# Patient Record
Sex: Female | Born: 1970
Health system: Southern US, Community
[De-identification: ages and names within clinical notes are randomized; demographics above are authoritative.]

## PROBLEM LIST (undated history)

## (undated) DIAGNOSIS — D509 Iron deficiency anemia, unspecified: Secondary | ICD-10-CM

## (undated) DIAGNOSIS — M797 Fibromyalgia: Secondary | ICD-10-CM

## (undated) DIAGNOSIS — M5136 Other intervertebral disc degeneration, lumbar region: Secondary | ICD-10-CM

## (undated) DIAGNOSIS — M51369 Other intervertebral disc degeneration, lumbar region without mention of lumbar back pain or lower extremity pain: Secondary | ICD-10-CM

## (undated) DIAGNOSIS — F329 Major depressive disorder, single episode, unspecified: Secondary | ICD-10-CM

## (undated) DIAGNOSIS — M5481 Occipital neuralgia: Secondary | ICD-10-CM

## (undated) DIAGNOSIS — F319 Bipolar disorder, unspecified: Secondary | ICD-10-CM

## (undated) DIAGNOSIS — I10 Essential (primary) hypertension: Secondary | ICD-10-CM

## (undated) DIAGNOSIS — F32A Depression, unspecified: Secondary | ICD-10-CM

## (undated) HISTORY — DX: Bipolar disorder, unspecified: F31.9

## (undated) HISTORY — DX: Fibromyalgia: M79.7

## (undated) HISTORY — DX: Depression, unspecified: F32.A

## (undated) HISTORY — DX: Major depressive disorder, single episode, unspecified: F32.9

## (undated) HISTORY — PX: PLACEMENT OF BREAST IMPLANTS: SHX6334

## (undated) HISTORY — DX: Iron deficiency anemia, unspecified: D50.9

## (undated) HISTORY — DX: Other intervertebral disc degeneration, lumbar region: M51.36

## (undated) HISTORY — DX: Other intervertebral disc degeneration, lumbar region without mention of lumbar back pain or lower extremity pain: M51.369

## (undated) HISTORY — DX: Occipital neuralgia: M54.81

---

## 2017-12-17 DIAGNOSIS — M5135 Other intervertebral disc degeneration, thoracolumbar region: Secondary | ICD-10-CM | POA: Diagnosis not present

## 2017-12-17 DIAGNOSIS — M25511 Pain in right shoulder: Secondary | ICD-10-CM | POA: Diagnosis not present

## 2017-12-17 DIAGNOSIS — Z6830 Body mass index (BMI) 30.0-30.9, adult: Secondary | ICD-10-CM | POA: Diagnosis not present

## 2017-12-17 DIAGNOSIS — R42 Dizziness and giddiness: Secondary | ICD-10-CM | POA: Diagnosis not present

## 2017-12-17 DIAGNOSIS — M797 Fibromyalgia: Secondary | ICD-10-CM | POA: Diagnosis not present

## 2018-01-05 DIAGNOSIS — E782 Mixed hyperlipidemia: Secondary | ICD-10-CM | POA: Diagnosis not present

## 2018-01-05 DIAGNOSIS — M797 Fibromyalgia: Secondary | ICD-10-CM | POA: Diagnosis not present

## 2018-01-05 DIAGNOSIS — Z0000 Encounter for general adult medical examination without abnormal findings: Secondary | ICD-10-CM | POA: Diagnosis not present

## 2018-01-05 DIAGNOSIS — M5481 Occipital neuralgia: Secondary | ICD-10-CM | POA: Diagnosis not present

## 2018-01-07 DIAGNOSIS — R42 Dizziness and giddiness: Secondary | ICD-10-CM | POA: Diagnosis not present

## 2018-01-07 DIAGNOSIS — Z1331 Encounter for screening for depression: Secondary | ICD-10-CM | POA: Diagnosis not present

## 2018-01-07 DIAGNOSIS — E785 Hyperlipidemia, unspecified: Secondary | ICD-10-CM | POA: Diagnosis not present

## 2018-01-07 DIAGNOSIS — M797 Fibromyalgia: Secondary | ICD-10-CM | POA: Diagnosis not present

## 2018-01-07 DIAGNOSIS — D473 Essential (hemorrhagic) thrombocythemia: Secondary | ICD-10-CM | POA: Diagnosis not present

## 2018-01-07 DIAGNOSIS — M50321 Other cervical disc degeneration at C4-C5 level: Secondary | ICD-10-CM | POA: Diagnosis not present

## 2018-01-07 DIAGNOSIS — M25511 Pain in right shoulder: Secondary | ICD-10-CM | POA: Diagnosis not present

## 2018-01-07 DIAGNOSIS — Z0001 Encounter for general adult medical examination with abnormal findings: Secondary | ICD-10-CM | POA: Diagnosis not present

## 2018-01-30 ENCOUNTER — Inpatient Hospital Stay (HOSPITAL_COMMUNITY): Payer: Medicare Other

## 2018-01-30 ENCOUNTER — Encounter (HOSPITAL_COMMUNITY): Payer: Self-pay | Admitting: Internal Medicine

## 2018-01-30 ENCOUNTER — Inpatient Hospital Stay (HOSPITAL_COMMUNITY): Payer: Medicare Other | Attending: Internal Medicine | Admitting: Internal Medicine

## 2018-01-30 VITALS — BP 121/76 | HR 124 | Temp 98.0°F | Resp 16 | Ht 59.75 in | Wt 153.2 lb

## 2018-01-30 DIAGNOSIS — N920 Excessive and frequent menstruation with regular cycle: Secondary | ICD-10-CM | POA: Insufficient documentation

## 2018-01-30 DIAGNOSIS — D500 Iron deficiency anemia secondary to blood loss (chronic): Secondary | ICD-10-CM | POA: Diagnosis not present

## 2018-01-30 DIAGNOSIS — D473 Essential (hemorrhagic) thrombocythemia: Secondary | ICD-10-CM | POA: Diagnosis not present

## 2018-01-30 DIAGNOSIS — M25559 Pain in unspecified hip: Secondary | ICD-10-CM

## 2018-01-30 DIAGNOSIS — M797 Fibromyalgia: Secondary | ICD-10-CM | POA: Diagnosis not present

## 2018-01-30 DIAGNOSIS — D75839 Thrombocytosis, unspecified: Secondary | ICD-10-CM

## 2018-01-30 DIAGNOSIS — F329 Major depressive disorder, single episode, unspecified: Secondary | ICD-10-CM | POA: Insufficient documentation

## 2018-01-30 DIAGNOSIS — Z803 Family history of malignant neoplasm of breast: Secondary | ICD-10-CM | POA: Diagnosis not present

## 2018-01-30 LAB — COMPREHENSIVE METABOLIC PANEL
ALK PHOS: 106 U/L (ref 38–126)
ALT: 76 U/L — ABNORMAL HIGH (ref 14–54)
AST: 55 U/L — AB (ref 15–41)
Albumin: 4 g/dL (ref 3.5–5.0)
Anion gap: 11 (ref 5–15)
BUN: 12 mg/dL (ref 6–20)
CALCIUM: 9.8 mg/dL (ref 8.9–10.3)
CO2: 27 mmol/L (ref 22–32)
Chloride: 105 mmol/L (ref 101–111)
Creatinine, Ser: 1.05 mg/dL — ABNORMAL HIGH (ref 0.44–1.00)
GFR calc Af Amer: >60 mL/min (ref >60)
GLUCOSE: 101 mg/dL — AB (ref 65–99)
POTASSIUM: 4 mmol/L (ref 3.5–5.1)
Sodium: 143 mmol/L (ref 135–145)
TOTAL PROTEIN: 7.9 g/dL (ref 6.5–8.1)
Total Bilirubin: 0.4 mg/dL (ref 0.3–1.2)

## 2018-01-30 LAB — CBC WITH DIFFERENTIAL/PLATELET
BASOS ABS: 0 10*3/uL (ref 0.0–0.1)
Basophils Relative: 0 %
Eosinophils Absolute: 0.4 10*3/uL (ref 0.0–0.7)
Eosinophils Relative: 4 %
HEMATOCRIT: 37.7 % (ref 36.0–46.0)
HEMOGLOBIN: 11.5 g/dL — AB (ref 12.0–15.0)
LYMPHS PCT: 32 %
Lymphs Abs: 3.2 10*3/uL (ref 0.7–4.0)
MCH: 25.7 pg — ABNORMAL LOW (ref 26.0–34.0)
MCHC: 30.5 g/dL (ref 30.0–36.0)
MCV: 84.3 fL (ref 78.0–100.0)
MONO ABS: 0.5 10*3/uL (ref 0.1–1.0)
MONOS PCT: 5 %
NEUTROS ABS: 5.7 10*3/uL (ref 1.7–7.7)
NEUTROS PCT: 59 %
Platelets: 503 10*3/uL — ABNORMAL HIGH (ref 150–400)
RBC: 4.47 MIL/uL (ref 3.87–5.11)
RDW: 16.3 % — AB (ref 11.5–15.5)
WBC: 9.9 10*3/uL (ref 4.0–10.5)

## 2018-01-30 LAB — FERRITIN: Ferritin: 5 ng/mL — ABNORMAL LOW (ref 11–307)

## 2018-01-30 LAB — LACTATE DEHYDROGENASE: LDH: 131 U/L (ref 98–192)

## 2018-01-30 LAB — SEDIMENTATION RATE: Sed Rate: 22 mm/hr (ref 0–22)

## 2018-01-30 LAB — C-REACTIVE PROTEIN: CRP: 1 mg/dL — ABNORMAL HIGH (ref <1.0)

## 2018-01-30 NOTE — Progress Notes (Signed)
Referring Physician:  Dr. Allyn Kenner Diagnosis Thrombocytosis [facility]) - Plan: CBC with Differential/Platelet, Comprehensive metabolic panel, Lactate dehydrogenase, Ferritin, Protein electrophoresis, serum, Sedimentation rate, C-reactive protein, Rheumatoid factor, BCR-ABL1, CML/ALL, PCR, QUANT, JAK2 genotypr, NM Bone Scan Whole Body  Pain in joint involving pelvic region and thigh, unspecified laterality - Plan: CBC with Differential/Platelet, Comprehensive metabolic panel, Lactate dehydrogenase, Ferritin, Protein electrophoresis, serum, Sedimentation rate, C-reactive protein, Rheumatoid factor, BCR-ABL1, CML/ALL, PCR, QUANT, JAK2 genotypr, NM Bone Scan Whole Body  Staging Cancer Staging No matching staging information was found for the patient.  Assessment and Plan: 1.  Thrombocytosis.  I discussed with her platelet count is minimally elevated at 554,000.  Her hemoglobin is normal at 12.  We will check CBC, CMP, ferritin,sed rate, C-reactive protein, rheumatoid factor, Jak 2,  BCR/ABL I suspect this is a reactive process or normal variant.  She will return to clinic in 2 weeks to go over her lab results.  All questions answered and she expressed understanding of the information presented.  2.  Joint pain.  She has a history of fibromyalgia.  Will check sed rate, C-reactive protein, rheumatoid factor, SPEP.  She will also be set up for bone scan for further evaluation.  She will return to clinic in 2 weeks to go over imaging studies.  3.  Fibromyalgia.  She should continue to follow-up with her primary care physician.  4.  Family history of breast cancer.  Patient reports sister was recently diagnosed.  She is advised to follow-up with her primary care physician for screening.  5.  Depression.  Patient is on Wellbutrin and Zoloft.  He should continue to follow with her primary care physician.  6.  Health maintenance.  Patient should continue to follow with her primary care physician and she is  recommended for mammogram screening.  HPI: 47 year old female referred for consultation due to thrombocytosis.  She has a history of fibromyalgia and reports diffuse joint aches.  She reports she was told she had anemia in the past approximately 10 years ago when she underwent C-section.  She has not undergone colonoscopy and has also not had mammograms performed.  Labs that were done on 01/05/2018 white count 9.6 hemoglobin 12.2 platelets 554,000 TSH was normal at 2.37 chemistries were within normal limits she had a normal alkaline phosphatase.  ANA was negative.  The patient reports her sister was recently with breast cancer.  The patient is a smoker.  Problem List There are no active problems to display for this patient.   Past Medical History Past Medical History:  Diagnosis Date  . DDD (degenerative disc disease), lumbar   . Depression     Past Surgical History Past Surgical History:  Procedure Laterality Date  . CESAREAN SECTION     2009  . PLACEMENT OF BREAST IMPLANTS Bilateral    1999    Family History Family History  Problem Relation Age of Onset  . Multiple sclerosis Mother   . Tuberculosis Father   . Kidney disease Father   . Mental illness Sister   . Diabetes Maternal Grandmother   . Stroke Paternal Grandmother   . Heart Problems Paternal Grandfather   . Breast cancer Sister   . Mental illness Sister   . Alcoholism Sister   . Mental illness Sister      Social History  reports that she has been smoking.  She has a 15.00 pack-year smoking history. she has never used smokeless tobacco. She reports that she does not drink alcohol  or use drugs.  Medications  Current Outpatient Medications:  .  buPROPion (WELLBUTRIN XL) 300 MG 24 hr tablet, , Disp: , Rfl:  .  diclofenac (VOLTAREN) 75 MG EC tablet, , Disp: , Rfl:  .  divalproex (DEPAKOTE ER) 250 MG 24 hr tablet, , Disp: , Rfl:  .  gabapentin (NEURONTIN) 300 MG capsule, , Disp: , Rfl:  .  nortriptyline (PAMELOR)  25 MG capsule, , Disp: , Rfl:  .  sertraline (ZOLOFT) 100 MG tablet, , Disp: , Rfl:   Allergies Penicillins  Review of Systems Review of Systems - Oncology ROS as per HPI otherwise 12 point ROS is negative other than diffuse muscle aches, fatigue   Physical Exam  Vitals Wt Readings from Last 3 Encounters:  01/30/18 153 lb 3.2 oz (69.5 kg)   Temp Readings from Last 3 Encounters:  01/30/18 98 F (36.7 C) (Oral)   BP Readings from Last 3 Encounters:  01/30/18 121/76   Pulse Readings from Last 3 Encounters:  01/30/18 (!) 124   Constitutional: Well-developed, well-nourished, and in no distress.   HENT: Head: Normocephalic and atraumatic.  Mouth/Throat: No oropharyngeal exudate. Mucosa moist. Eyes: Pupils are equal, round, and reactive to light. Conjunctivae are normal. No scleral icterus.  Neck: Normal range of motion. Neck supple. No JVD present.  Cardiovascular: Normal rate, regular rhythm and normal heart sounds.  Exam reveals no gallop and no friction rub.   No murmur heard. Pulmonary/Chest: Effort normal and breath sounds normal. No respiratory distress. No wheezes.No rales.  Abdominal: Soft. Bowel sounds are normal. No distension.  She reports some tenderness, but relates this to fibromyalgia.  There is no guarding.  Musculoskeletal: No edema.  She reports Diffuse joint pain.   Lymphadenopathy: No cervical, axillary or supraclavicular adenopathy.  Neurological: Alert and oriented to person, place, and time. No cranial nerve deficit.  Skin: Skin is warm and dry. No rash noted. No erythema. No pallor.  Psychiatric: Affect and judgment normal.   Labs No visits with results within 3 Day(s) from this visit.  Latest known visit with results is:  No results found for any previous visit.     Pathology Orders Placed This Encounter  Procedures  . NM Bone Scan Whole Body    Standing Status:   Future    Standing Expiration Date:   01/30/2019    Scheduling Instructions:      Evaluate for arthritis    Order Specific Question:   If indicated for the ordered procedure, I authorize the administration of a radiopharmaceutical per Radiology protocol    Answer:   Yes    Order Specific Question:   Is the patient pregnant?    Answer:   No    Order Specific Question:   Preferred imaging location?    Answer:   [facility]    Order Specific Question:   Radiology Contrast Protocol - do NOT remove file path    Answer:   [email or web address]  . CBC with Differential/Platelet    Standing Status:   Future    Standing Expiration Date:   01/31/2019  . Comprehensive metabolic panel    Standing Status:   Future    Standing Expiration Date:   01/31/2019  . Lactate dehydrogenase    Standing Status:   Future    Standing Expiration Date:   01/31/2019  . Ferritin    Standing Status:   Future    Standing Expiration Date:   01/31/2019  . Protein electrophoresis, serum  Standing Status:   Future    Standing Expiration Date:   01/31/2019  . Sedimentation rate    Standing Status:   Future    Standing Expiration Date:   01/30/2019  . C-reactive protein    Standing Status:   Future    Standing Expiration Date:   01/30/2019  . Rheumatoid factor    Standing Status:   Future    Standing Expiration Date:   01/30/2019  . BCR-ABL1, CML/ALL, PCR, QUANT  . JAK2 genotypr    Standing Status:   Future    Standing Expiration Date:   01/30/2019     Zoila Shutter MD   Cc:  Dr. Allyn Kenner

## 2018-01-30 NOTE — Patient Instructions (Addendum)
[place] at [facility] Discharge Instructions  You saw Dr. Walden Field today. We will do lab work today.  We will schedule you for a bone scan today.  We will see you in 2 to 4 weeks.    Thank you for choosing [place] at [facility] to provide your oncology and hematology care.  To afford each patient quality time with our provider, please arrive at least 15 minutes before your scheduled appointment time.   If you have a lab appointment with the [facility] please come in thru the  Main Entrance and check in at the main information desk  You need to re-schedule your appointment should you arrive 10 or more minutes late.  We strive to give you quality time with our providers, and arriving late affects you and other patients whose appointments are after yours.  Also, if you no show three or more times for appointments you may be dismissed from the clinic at the providers discretion.     Again, thank you for choosing [facility].  Our hope is that these requests will decrease the amount of time that you wait before being seen by our physicians.       _____________________________________________________________  Should you have questions after your visit to [facility], please contact our office at [phone] between the hours of 8:30 a.m. and 4:30 p.m.  Voicemails left after 4:30 p.m. will not be returned until the following business day.  For prescription refill requests, have your pharmacy contact our office.       Resources For Cancer Patients and their Caregivers ? American Cancer Society: Can assist with transportation, wigs, general needs, runs Look Good Feel Better.        301-137-1656 ? Cancer Care: Provides financial assistance, online support groups, medication/co-pay assistance.  1-800-813-HOPE 610-476-4059) ? [place] Assists [place] cancer patients and their families  through emotional , educational and financial support.  726 594 1236 ? [place] DSS Where to apply for food stamps, Medicaid and utility assistance. 706 537 8144 ? [employer]: Transportation to medical appointments. 413-691-9281 ? Social Security Administration: May apply for disability if have a Stage IV cancer. 949 228 6120 (312)340-1530 ? [employer], Disability and Transit Services: Assists with nutrition, care and transit needs. [place] Support Programs:   > Cancer Support Group  2nd Tuesday of the month 1pm-2pm, Journey Room   > Creative Journey  3rd Tuesday of the month 1130am-1pm, Journey Room

## 2018-01-31 LAB — RHEUMATOID FACTOR: Rhuematoid fact SerPl-aCnc: <10 IU/mL (ref 0.0–13.9)

## 2018-02-02 LAB — JAK2 GENOTYPR

## 2018-02-02 LAB — PROTEIN ELECTROPHORESIS, SERUM
A/G Ratio: 1.1 (ref 0.7–1.7)
Albumin ELP: 3.9 g/dL (ref 2.9–4.4)
Alpha-1-Globulin: 0.2 g/dL (ref 0.0–0.4)
Alpha-2-Globulin: 0.9 g/dL (ref 0.4–1.0)
Beta Globulin: 1.2 g/dL (ref 0.7–1.3)
GAMMA GLOBULIN: 1.3 g/dL (ref 0.4–1.8)
Globulin, Total: 3.6 g/dL (ref 2.2–3.9)
Total Protein ELP: 7.5 g/dL (ref 6.0–8.5)

## 2018-02-09 LAB — BCR-ABL1, CML/ALL, PCR, QUANT

## 2018-02-11 ENCOUNTER — Encounter (HOSPITAL_COMMUNITY)
Admission: RE | Admit: 2018-02-11 | Discharge: 2018-02-11 | Disposition: A | Discharge status: Home / Self Care | Payer: Medicare Other | Source: Ambulatory Visit | Attending: Internal Medicine | Admitting: Internal Medicine

## 2018-02-11 ENCOUNTER — Encounter (HOSPITAL_COMMUNITY): Payer: Self-pay

## 2018-02-11 DIAGNOSIS — D473 Essential (hemorrhagic) thrombocythemia: Secondary | ICD-10-CM | POA: Diagnosis not present

## 2018-02-11 DIAGNOSIS — M25559 Pain in unspecified hip: Secondary | ICD-10-CM | POA: Insufficient documentation

## 2018-02-11 DIAGNOSIS — D75839 Thrombocytosis, unspecified: Secondary | ICD-10-CM

## 2018-02-11 DIAGNOSIS — M797 Fibromyalgia: Secondary | ICD-10-CM | POA: Diagnosis not present

## 2018-02-11 MED ORDER — TECHNETIUM TC 99M MEDRONATE IV KIT
20.0000 | PACK | Freq: Once | INTRAVENOUS | Status: AC | PRN
Start: 2018-02-11 — End: 2018-02-11
  Administered 2018-02-11: 19.9 via INTRAVENOUS

## 2018-02-20 ENCOUNTER — Encounter (HOSPITAL_COMMUNITY): Payer: Self-pay | Admitting: Internal Medicine

## 2018-02-20 ENCOUNTER — Inpatient Hospital Stay (HOSPITAL_BASED_OUTPATIENT_CLINIC_OR_DEPARTMENT_OTHER): Payer: Medicare Other | Admitting: Internal Medicine

## 2018-02-20 ENCOUNTER — Other Ambulatory Visit: Payer: Self-pay

## 2018-02-20 VITALS — BP 114/67 | HR 120 | Temp 98.6°F | Resp 16 | Wt 156.0 lb

## 2018-02-20 DIAGNOSIS — N920 Excessive and frequent menstruation with regular cycle: Secondary | ICD-10-CM | POA: Diagnosis not present

## 2018-02-20 DIAGNOSIS — D500 Iron deficiency anemia secondary to blood loss (chronic): Secondary | ICD-10-CM | POA: Diagnosis not present

## 2018-02-20 DIAGNOSIS — D473 Essential (hemorrhagic) thrombocythemia: Secondary | ICD-10-CM | POA: Diagnosis not present

## 2018-02-20 DIAGNOSIS — F329 Major depressive disorder, single episode, unspecified: Secondary | ICD-10-CM | POA: Diagnosis not present

## 2018-02-20 DIAGNOSIS — M797 Fibromyalgia: Secondary | ICD-10-CM

## 2018-02-20 NOTE — Patient Instructions (Addendum)
[place] at [facility] Discharge Instructions   You were seen today by Dr. Zoila Shutter. She went over your recent lab results and for the most part your labs were normal. Your iron levels were low. She discussed your periods and heavy they are. She also talked with you about going to a GYN doctor. Dr. Walden Field discussed getting IV iron and getting labs. She also discussed oral iron therapy. She feels like IV iron would be the best therapy for you, you would see faster results. You would get 2 doses of Iron.  Dr. Walden Field would like for you to start taking Ferrous Sulfate 325mg  over the counter, take this for 2-3 months and we will see you back after that for follow up. We will refer you to a GI doctor and a Editor, commissioning. We will see you back in 3 months for labs and follow up.   Thank you for choosing [place] at [facility] to provide your oncology and hematology care.  To afford each patient quality time with our provider, please arrive at least 15 minutes before your scheduled appointment time.    If you have a lab appointment with the [facility] please come in thru the  Main Entrance and check in at the main information desk  You need to re-schedule your appointment should you arrive 10 or more minutes late.  We strive to give you quality time with our providers, and arriving late affects you and other patients whose appointments are after yours.  Also, if you no show three or more times for appointments you may be dismissed from the clinic at the providers discretion.     Again, thank you for choosing [facility].  Our hope is that these requests will decrease the amount of time that you wait before being seen by our physicians.       _____________________________________________________________  Should you have questions after your visit to [facility], please contact our office at [phone] between the hours  of 8:30 a.m. and 4:30 p.m.  Voicemails left after 4:30 p.m. will not be returned until the following business day.  For prescription refill requests, have your pharmacy contact our office.       Resources For Cancer Patients and their Caregivers ? American Cancer Society: Can assist with transportation, wigs, general needs, runs Look Good Feel Better.        331-551-5947 ? Cancer Care: Provides financial assistance, online support groups, medication/co-pay assistance.  1-800-813-HOPE 203-131-1509) ? [place] Assists [place] cancer patients and their families through emotional , educational and financial support.  731-324-8794 ? [place] DSS Where to apply for food stamps, Medicaid and utility assistance. 801-704-5721 ? [employer]: Transportation to medical appointments. (765)869-6954 ? Social Security Administration: May apply for disability if have a Stage IV cancer. 3460086622 724-149-4830 ? [employer], Disability and Transit Services: Assists with nutrition, care and transit needs. [place] Support Programs:   > Cancer Support Group  2nd Tuesday of the month 1pm-2pm, Journey Room   > Creative Journey  3rd Tuesday of the month 1130am-1pm, Journey Room

## 2018-02-20 NOTE — Progress Notes (Signed)
Diagnosis Iron deficiency anemia due to chronic blood loss - Plan: CBC with Differential/Platelet, Comprehensive metabolic panel, Lactate dehydrogenase, Ferritin  Staging Cancer Staging No matching staging information was found for the patient.  Assessment and Plan:  1.  Thrombocytosis.  Labs done 01/30/2018 show WBC 9.9 HB 11.5 plts 503,000 Ferritin 5.  Jak2 and BCR/ABL negative.  This is likely a reactive process due to iron deficiency anemia.  She will return to clinic in 3 months for repeat labs.    2.  Iron deficiency anemia.  Labs done 01/30/2018 showed a hemoglobin of 11.5.  Ferritin was 5.  She reports menorrhagia.  She was given option of IV iron versus oral iron.  She desires to try oral iron.  She will be referred to GYN and GI for evaluation.  SPEP was negative.  She will return to clinic in 3 months for repeat lab evaluation.    3.  Menorrhagia.  Patient reports she is on her menstrual cycle today.  She will be referred to GYN for evaluation.  4.  Fibromyalgia.  Follow-up with primary care physician.  5.  Family history of breast cancer.  Patient reports sister was recently diagnosed.  She is advised to follow-up with her primary care physician for screening mammogram recommendations.    6.  Depression.  Patient is on Wellbutrin and Zoloft.  He should continue to follow with her primary care physician.  Interval History:  47 year old female referred for initial consultation due to thrombocytosis.  She has a history of fibromyalgia and reports diffuse joint aches.  She reports she was told she had anemia in the past approximately 10 years ago when she underwent C-section.  She has not undergone colonoscopy and has also not had mammograms performed.  Labs that were done on 01/05/2018 white count 9.6 hemoglobin 12.2 platelets 554,000 TSH was normal at 2.37 chemistries were within normal limits she had a normal alkaline phosphatase.  ANA was negative.  The patient reports her sister was  recently with breast cancer.  The patient is a smoker.  Current Status:  Pt is seen today for follow-up to go over lab studies.  She reports menorrhagia.     Problem List There are no active problems to display for this patient.   Past Medical History Past Medical History:  Diagnosis Date  . DDD (degenerative disc disease), lumbar   . Depression   IDA.     Past Surgical History Past Surgical History:  Procedure Laterality Date  . CESAREAN SECTION     2009  . PLACEMENT OF BREAST IMPLANTS Bilateral    1999    Family History Family History  Problem Relation Age of Onset  . Multiple sclerosis Mother   . Tuberculosis Father   . Kidney disease Father   . Mental illness Sister   . Diabetes Maternal Grandmother   . Stroke Paternal Grandmother   . Heart Problems Paternal Grandfather   . Breast cancer Sister   . Mental illness Sister   . Alcoholism Sister   . Mental illness Sister      Social History  reports that she has been smoking.  She has a 15.00 pack-year smoking history. She has never used smokeless tobacco. She reports that she does not drink alcohol or use drugs.  Medications  Current Outpatient Medications:  .  buPROPion (WELLBUTRIN XL) 300 MG 24 hr tablet, , Disp: , Rfl:  .  diclofenac (VOLTAREN) 75 MG EC tablet, , Disp: , Rfl:  .  divalproex (DEPAKOTE ER) 250 MG 24 hr tablet, , Disp: , Rfl:  .  gabapentin (NEURONTIN) 300 MG capsule, , Disp: , Rfl:  .  nortriptyline (PAMELOR) 25 MG capsule, , Disp: , Rfl:  .  sertraline (ZOLOFT) 100 MG tablet, , Disp: , Rfl:   Allergies Penicillins  Review of Systems Review of Systems - Oncology ROS as per HPI otherwise 12 point ROS is negative.   Physical Exam  Vitals Wt Readings from Last 3 Encounters:  02/20/18 156 lb (70.8 kg)  01/30/18 153 lb 3.2 oz (69.5 kg)   Temp Readings from Last 3 Encounters:  02/20/18 98.6 F (37 C) (Oral)  01/30/18 98 F (36.7 C) (Oral)   BP Readings from Last 3 Encounters:   02/20/18 114/67  01/30/18 121/76   Pulse Readings from Last 3 Encounters:  02/20/18 (!) 120  01/30/18 (!) 124   Constitutional: Well-developed, well-nourished, and in no distress.   HENT: Head: Normocephalic and atraumatic.  Mouth/Throat: No oropharyngeal exudate. Mucosa moist. Eyes: Pupils are equal, round, and reactive to light. Conjunctivae are normal. No scleral icterus.  Neck: Normal range of motion. Neck supple. No JVD present.  Cardiovascular: Normal rate, regular rhythm and normal heart sounds.  Exam reveals no gallop and no friction rub.   No murmur heard. Pulmonary/Chest: Effort normal and breath sounds normal. No respiratory distress. No wheezes.No rales.  Abdominal: Soft. Bowel sounds are normal. No distension.  Tender to palpation.  No guarding.   Musculoskeletal: No edema or tenderness.  Lymphadenopathy: No cervical, axillary or supraclavicular adenopathy.  Neurological: Alert and oriented to person, place, and time. No cranial nerve deficit.  Skin: Skin is warm and dry. No rash noted. No erythema. No pallor.  Psychiatric: Affect and judgment normal.   Labs No visits with results within 3 Day(s) from this visit.  Latest known visit with results is:  Appointment on 01/30/2018  Component Date Value Ref Range Status  . WBC 01/30/2018 9.9  4.0 - 10.5 K/uL Final  . RBC 01/30/2018 4.47  3.87 - 5.11 MIL/uL Final  . Hemoglobin 01/30/2018 11.5* 12.0 - 15.0 g/dL Final  . HCT 01/30/2018 37.7  36.0 - 46.0 % Final  . MCV 01/30/2018 84.3  78.0 - 100.0 fL Final  . MCH 01/30/2018 25.7* 26.0 - 34.0 pg Final  . MCHC 01/30/2018 30.5  30.0 - 36.0 g/dL Final  . RDW 01/30/2018 16.3* 11.5 - 15.5 % Final  . Platelets 01/30/2018 503* 150 - 400 K/uL Final  . Neutrophils Relative % 01/30/2018 59  % Final  . Neutro Abs 01/30/2018 5.7  1.7 - 7.7 K/uL Final  . Lymphocytes Relative 01/30/2018 32  % Final  . Lymphs Abs 01/30/2018 3.2  0.7 - 4.0 K/uL Final  . Monocytes Relative 01/30/2018 5  %  Final  . Monocytes Absolute 01/30/2018 0.5  0.1 - 1.0 K/uL Final  . Eosinophils Relative 01/30/2018 4  % Final  . Eosinophils Absolute 01/30/2018 0.4  0.0 - 0.7 K/uL Final  . Basophils Relative 01/30/2018 0  % Final  . Basophils Absolute 01/30/2018 0.0  0.0 - 0.1 K/uL Final   Performed at [facility], [address], [place], [place] [address]  . Sodium 01/30/2018 143  135 - 145 mmol/L Final  . Potassium 01/30/2018 4.0  3.5 - 5.1 mmol/L Final  . Chloride 01/30/2018 105  101 - 111 mmol/L Final  . CO2 01/30/2018 27  22 - 32 mmol/L Final  . Glucose, Bld 01/30/2018 101* 65 - 99  mg/dL Final  . BUN 01/30/2018 12  6 - 20 mg/dL Final  . Creatinine, Ser 01/30/2018 1.05* 0.44 - 1.00 mg/dL Final  . Calcium 01/30/2018 9.8  8.9 - 10.3 mg/dL Final  . Total Protein 01/30/2018 7.9  6.5 - 8.1 g/dL Final  . Albumin 01/30/2018 4.0  3.5 - 5.0 g/dL Final  . AST 01/30/2018 55* 15 - 41 U/L Final  . ALT 01/30/2018 76* 14 - 54 U/L Final  . Alkaline Phosphatase 01/30/2018 106  38 - 126 U/L Final  . Total Bilirubin 01/30/2018 0.4  0.3 - 1.2 mg/dL Final  . GFR calc non Af Amer 01/30/2018 >60  >60 mL/min Final  . GFR calc Af Amer 01/30/2018 >60  >60 mL/min Final   Comment: (NOTE) The eGFR has been calculated using the CKD EPI equation. This calculation has not been validated in all clinical situations. eGFR's persistently <60 mL/min signify possible Chronic Kidney Disease.   Georgiann Hahn gap 01/30/2018 11  5 - 15 Final   Performed at [facility], [address], [place], [place] [address]  . LDH 01/30/2018 131  98 - 192 U/L Final   Performed at [facility], [address], [place], [place] [address]  . Ferritin 01/30/2018 5* 11 - 307 ng/mL Final   Performed at [facility], [place] [address], [place], [place] [address]  . Total Protein ELP 01/30/2018 7.5  6.0 - 8.5 g/dL Final  . Albumin ELP 01/30/2018 3.9  2.9 - 4.4 g/dL Final  . Alpha-1-Globulin 01/30/2018 0.2  0.0 - 0.4 g/dL Final  . Alpha-2-Globulin  01/30/2018 0.9  0.4 - 1.0 g/dL Final  . Beta Globulin 01/30/2018 1.2  0.7 - 1.3 g/dL Final  . Gamma Globulin 01/30/2018 1.3  0.4 - 1.8 g/dL Final  . M-Spike, % 01/30/2018 Not Observed  Not Observed g/dL Final  . SPE Interp. 01/30/2018 Comment   Final   Comment: (NOTE) The SPE pattern appears essentially unremarkable. Evidence of monoclonal protein is not apparent. Performed At: [facility] [place], [place] [id number] Rush Farmer MD [id number]   . Comment 01/30/2018 Comment   Final   Comment: (NOTE) Protein electrophoresis scan will follow via computer, mail, or courier delivery.   Marland Kitchen GLOBULIN, TOTAL 01/30/2018 3.6  2.2 - 3.9 g/dL Corrected  . A/G Ratio 01/30/2018 1.1  0.7 - 1.7 Corrected   Performed at [facility], [address], [place], [place] [address]  . Sed Rate 01/30/2018 22  0 - 22 mm/hr Final   Performed at [facility], [address], [place], [place] [address]  . CRP 01/30/2018 1.0* <1.0 mg/dL Final   Performed at [facility] [address], [place], [place] [address]  . Rhuematoid fact SerPl-aCnc 01/30/2018 <10.0  0.0 - 13.9 IU/mL Final   Comment: (NOTE) Performed At: [facility] [place], [place] [id number] Rush Farmer MD [id number] Performed at [facility], [address] [place],  [address]   . JAK2 GenotypR 01/30/2018 Comment   Final   Comment: (NOTE) Result: NEGATIVE for the JAK2 V617F mutation. Interpretation:  The G to T nucleotide change encoding the V617F mutation was not detected.  This result does not rule out the presence of the JAK2 mutation at a level below the sensitivity of detection of this assay, or the presence of other mutations within JAK2 not detected by this assay.  This result does not rule out a diagnosis of polycythemia vera, essential thrombocythemia or idiopathic  myelofibrosis as the V617F mutation is not detected in all patients with these disorders.   .  Director Review, JAK2 01/30/2018 Comment   Corrected   Comment: (NOTE) Constance Goltz, PhD, [facility]               Director, [facility] [place], [place]               [phone] This test was developed and its performance characteristics determined by [facility]. It has not been cleared or approved by the Food and Drug Administration. Performed At: [facility] [address] [place], [place] [id number] Nechama Guard MD FX:[id number] Performed At: [facility] [place], [place] [id number] Nechama Guard MD [id number]   . BACKGROUND: 01/30/2018 Comment   Corrected   Comment: (NOTE) JAK2 is a cytoplasmic tyrosine kinase with a key role in signal transduction from multiple hematopoietic growth factor receptors. A point mutation within exon 14 of the JAK2 gene ([id number]) encoding a valine to phenylalanine substitution at position 617 of the JAK2 protein (V617F) has been identified in most patients with polycythemia vera, and in about half of those with either essential thrombocythemia or idiopathic myelofibrosis. The V617F has also been detected, although infrequently, in other myeloid disorders such as chronic myelomonocytic leukemia and chronic neutrophilic luekemia. V617F is an acquired mutation that alters a highly conserved valine present in the negative regulatory JH2 domain of the JAK2 protein and is predicted to dysregulate kinase activity. Methodology: Total genomic DNA was extracted and subjected to TaqMan real-time PCR amplification/detection. Two amplification products per sample were monitored by real-time PCR using primers/probes s                          pecific to JAK2 wild type (WT) and JAK2 mutant V617F. The ABI7900 Absolute Quantitation software will compare the patient specimen valuse to the standard curves and generate  percent values for wild type and mutant type. In vitro studies have indicated that this assay has an analytical sensitivity of 1%. References: Baxter EJ, Scott Phineas Real, et al. Acquired mutation of the tyrosine kinase JAK2 in human myeloproliferative disorders. Lancet. 2005 Mar 19-25; 365(9464):1054-1061. Alfonso Ramus Couedic JP. A unique clonal JAK2 mutation leading to constitutive signaling causes polycythaemia vera. Nature. 2005 Apr 28; 434(7037):1144-1148. Kralovics R, Passamonti F, Buser AS, et al. A gain-of-function mutation of JAK2 in myeloproliferative disorders. N Engl J Med. 2005 Apr 28; 352(17):1779-1790. Performed at [facility], [address] [place], [place] [address]      Pathology Orders Placed This Encounter  Procedures  . CBC with Differential/Platelet    Standing Status:   Future    Standing Expiration Date:   02/21/2019  . Comprehensive metabolic panel    Standing Status:   Future    Standing Expiration Date:   02/21/2019  . Lactate dehydrogenase    Standing Status:   Future    Standing Expiration Date:   02/21/2019  . Ferritin    Standing Status:   Future    Standing Expiration Date:   02/21/2019       Zoila Shutter MD

## 2018-02-25 ENCOUNTER — Encounter: Payer: Self-pay | Admitting: Internal Medicine

## 2018-03-06 ENCOUNTER — Other Ambulatory Visit (HOSPITAL_COMMUNITY)
Admission: RE | Admit: 2018-03-06 | Discharge: 2018-03-06 | Disposition: A | Discharge status: Home / Self Care | Payer: Medicare Other | Source: Ambulatory Visit | Attending: Adult Health | Admitting: Adult Health

## 2018-03-06 ENCOUNTER — Ambulatory Visit (INDEPENDENT_AMBULATORY_CARE_PROVIDER_SITE_OTHER): Payer: Medicare Other | Admitting: Adult Health

## 2018-03-06 ENCOUNTER — Encounter: Payer: Self-pay | Admitting: Adult Health

## 2018-03-06 VITALS — BP 110/60 | HR 122 | Ht 59.0 in | Wt 158.5 lb

## 2018-03-06 DIAGNOSIS — Z01419 Encounter for gynecological examination (general) (routine) without abnormal findings: Secondary | ICD-10-CM | POA: Diagnosis not present

## 2018-03-06 DIAGNOSIS — R140 Abdominal distension (gaseous): Secondary | ICD-10-CM | POA: Diagnosis not present

## 2018-03-06 DIAGNOSIS — F329 Major depressive disorder, single episode, unspecified: Secondary | ICD-10-CM

## 2018-03-06 DIAGNOSIS — F319 Bipolar disorder, unspecified: Secondary | ICD-10-CM

## 2018-03-06 DIAGNOSIS — N920 Excessive and frequent menstruation with regular cycle: Secondary | ICD-10-CM | POA: Insufficient documentation

## 2018-03-06 DIAGNOSIS — M797 Fibromyalgia: Secondary | ICD-10-CM | POA: Diagnosis not present

## 2018-03-06 DIAGNOSIS — Z1212 Encounter for screening for malignant neoplasm of rectum: Secondary | ICD-10-CM | POA: Diagnosis not present

## 2018-03-06 DIAGNOSIS — Z1211 Encounter for screening for malignant neoplasm of colon: Secondary | ICD-10-CM | POA: Diagnosis not present

## 2018-03-06 DIAGNOSIS — Z124 Encounter for screening for malignant neoplasm of cervix: Secondary | ICD-10-CM | POA: Diagnosis not present

## 2018-03-06 LAB — HEMOCCULT GUIAC POC 1CARD (OFFICE): Fecal Occult Blood, POC: NEGATIVE

## 2018-03-06 NOTE — Patient Instructions (Signed)

## 2018-03-06 NOTE — Progress Notes (Signed)
Patient ID: Susan Dougherty, female   DOB: 01/28/1971, 47 y.o.   MRN: [id number] History of Present Illness: Susan Dougherty is a 47 year old white female, [id number], in for well woman gyn exam and pap, she is a new patient.Last pap 10 years ago. PCP is Wende Neighbors and she sees Dr Walden Field.   Current Medications, Allergies, Past Medical History, Past Surgical History, Family History and Social History were reviewed in [employer] record.     Review of Systems: Patient denies any headaches, hearing loss, fatigue, blurred vision, shortness of breath, chest pain, abdominal pain, problems with bowel movements, urination, or intercourse. Has fibromyalgia and is on meds for depression and bipolar. Feels bloated and full. +heavy periods 3/6 days,changes tampons every 2 hours  She has elevated platelets, low ferritin, and low hemglobin,and has appt with Anselmo Rod in May at [facility].     Physical Exam:BP 110/60 (BP Location: Left Arm, Patient Position: Sitting, Cuff Size: Normal)   Pulse (!) 122   Ht 4\' 11"  (1.499 m)   Wt 158 lb 8 oz (71.9 kg)   LMP 02/18/2018 (Approximate)   BMI 32.01 kg/m  General:  Well developed, well nourished, no acute distress Skin:  Warm and dry Neck:  Midline trachea, normal thyroid, good ROM, no lymphadenopathy Lungs; Clear to auscultation bilaterally Breast:  No dominant palpable mass, retraction, or nipple discharge,has bilateral implants. Cardiovascular: Regular rate and rhythm Abdomen:  Soft, non tender, no hepatosplenomegaly Pelvic:  External genitalia is normal in appearance, no lesions.  The vagina is normal in appearance. Urethra has no lesions or masses. The cervix is bulbous. Pap with HPV and GC/CHL performed. Uterus is felt to be normal size, shape, and contour.  No adnexal masses or tenderness noted.Bladder is non tender, no masses felt. Rectal: Good sphincter tone, no polyps, or hemorrhoids felt.  Hemoccult negative. Extremities/musculoskeletal:  No  swelling or varicosities noted, no clubbing or cyanosis Psych:  No mood changes, alert and cooperative,seems happy PHQ 2 score 1.  Impression:  1. Encounter for gynecological examination with Papanicolaou smear of cervix   2. Routine cervical smear   3. Screening for colorectal cancer   4. Menorrhagia with regular cycle      Plan: Return in 1 week for GYN [place] Physical in 1 year Pap in 3 if normal Get mammogram now and yearly

## 2018-03-10 LAB — CYTOLOGY - PAP
ADEQUACY: ABSENT
Chlamydia: NEGATIVE
Diagnosis: NEGATIVE
HPV (WINDOPATH): NOT DETECTED
NEISSERIA GONORRHEA: NEGATIVE

## 2018-03-11 ENCOUNTER — Other Ambulatory Visit: Payer: Medicare Other

## 2018-03-20 ENCOUNTER — Other Ambulatory Visit: Payer: Medicare Other

## 2018-03-30 ENCOUNTER — Ambulatory Visit: Payer: Medicare Other

## 2018-04-08 ENCOUNTER — Ambulatory Visit (INDEPENDENT_AMBULATORY_CARE_PROVIDER_SITE_OTHER): Payer: Medicare Other

## 2018-04-08 DIAGNOSIS — N920 Excessive and frequent menstruation with regular cycle: Secondary | ICD-10-CM | POA: Diagnosis not present

## 2018-04-08 NOTE — Progress Notes (Signed)
PELVIC US TA/TV: homogeneous anteverted uterus,wnl,EEC 6.4 mm,echogenic endometrial polyp w/color flow 2 x .5 x 1.2 cm,normal ovaries bilat,no free fluid,ovaries appear mobile,no pain during ultrasound

## 2018-04-09 ENCOUNTER — Telehealth: Payer: Self-pay | Admitting: Adult Health

## 2018-04-09 NOTE — Telephone Encounter (Signed)
Left message to call about US results  

## 2018-04-15 ENCOUNTER — Encounter: Payer: Self-pay | Admitting: Adult Health

## 2018-04-16 ENCOUNTER — Telehealth: Payer: Self-pay | Admitting: Adult Health

## 2018-04-16 NOTE — Telephone Encounter (Signed)
Pt aware of [place] and endometrial polyp to make app twith Dr Glo Herring

## 2018-04-24 ENCOUNTER — Telehealth: Payer: Self-pay

## 2018-04-24 ENCOUNTER — Encounter: Payer: Self-pay | Admitting: Gastroenterology

## 2018-04-24 ENCOUNTER — Ambulatory Visit (INDEPENDENT_AMBULATORY_CARE_PROVIDER_SITE_OTHER): Payer: Medicare Other | Admitting: Gastroenterology

## 2018-04-24 ENCOUNTER — Other Ambulatory Visit: Payer: Self-pay

## 2018-04-24 VITALS — BP 124/76 | HR 115 | Temp 98.8°F | Ht 59.0 in | Wt 166.0 lb

## 2018-04-24 DIAGNOSIS — K5900 Constipation, unspecified: Secondary | ICD-10-CM | POA: Insufficient documentation

## 2018-04-24 DIAGNOSIS — R1319 Other dysphagia: Secondary | ICD-10-CM

## 2018-04-24 DIAGNOSIS — D509 Iron deficiency anemia, unspecified: Secondary | ICD-10-CM | POA: Insufficient documentation

## 2018-04-24 DIAGNOSIS — R1013 Epigastric pain: Secondary | ICD-10-CM | POA: Diagnosis not present

## 2018-04-24 DIAGNOSIS — R1310 Dysphagia, unspecified: Secondary | ICD-10-CM | POA: Insufficient documentation

## 2018-04-24 DIAGNOSIS — D500 Iron deficiency anemia secondary to blood loss (chronic): Secondary | ICD-10-CM

## 2018-04-24 MED ORDER — LUBIPROSTONE 24 MCG PO CAPS: 24.0000 ug | ORAL_CAPSULE | Freq: Two times a day (BID) | ORAL | 5 refills | Status: DC

## 2018-04-24 MED ORDER — NA SULFATE-K SULFATE-MG SULF 17.5-3.13-1.6 GM/177ML PO SOLN: 1.0000 | ORAL | 0 refills | Status: DC

## 2018-04-24 NOTE — Patient Instructions (Signed)
1. Start Amitiza 53mcg twice daily with food for constipation. RX sent to your pharmacy. IT IS VERY IMPORTANT TO HAVE REGULAR BOWEL MOVEMENTS FOR 1-2 WEEKS PRIOR TO YOUR COLONOSCOPY TO MAKE SURE YOUR COLON IS CLEANED OUT WELL FOR THE PROCEDURE.  2. Please have your labs done at [facility]. 3. Colonoscopy with upper endoscopy as scheduled. See separate instructions.

## 2018-04-24 NOTE — Progress Notes (Signed)
Primary Care Physician:  Celene Squibb, MD Referring MD: Zoila Shutter Primary Gastroenterologist:  Garfield Cornea, MD   Chief Complaint  Patient presents with  . iron def    HPI:  Susan Dougherty is a 47 y.o. female here for further evaluation of IDA at the request of Dr. Walden Field. Patient has been seeing hematology for thrombocytosis felt to be reactive in the setting of IDA. Hgb 11.5 in 01/2018. Ferritin 5 at that time. Next labs planned in 04/2018. She is on oral iron, was offered IV but patient declined.   She was referred to gyn and [place] by Dr. Walden Field due to Platea. Heme negative stool 03/06/18 by gyn. Patient reports heavier menstrual cycles over the last five years. More heavier days. She has an endometrial polyp. She is going to consider hysteroscopic removal of the polyp and endometrial ablation.   She has h/o intermittent aleve use. Previously was on diclofenac. Has DDD, occiptal neuralgia.   Wheat causes bloating. Infrequent BM. Eats one meal daily for years. No melena, brbpr. Trying dietary measures to get bowels regular. Heartburn and gas. OTC antacids like rolaids help. +solid food  dysphaghia. Complains of upper abdominal pain.   Current Outpatient Medications  Medication Sig Dispense Refill  . buPROPion (WELLBUTRIN XL) 300 MG 24 hr tablet Take 300 mg by mouth daily.     . diclofenac (VOLTAREN) 75 MG EC tablet Take 75 mg by mouth daily.     . divalproex (DEPAKOTE ER) 250 MG 24 hr tablet Take 250 mg by mouth daily.     Marland Kitchen gabapentin (NEURONTIN) 300 MG capsule Take 300 mg by mouth 4 (four) times daily.     . Multiple Vitamin (MULTIVITAMIN) tablet Take 1 tablet by mouth daily.    . Naproxen Sodium (ALEVE PO) Take by mouth as needed.    . nortriptyline (PAMELOR) 25 MG capsule Take 25 mg by mouth daily.     . Nutritional Supplements (DHEA PO) Take by mouth daily.    Marland Kitchen OVER THE COUNTER MEDICATION Hemp oil daily    . sertraline (ZOLOFT) 100 MG tablet Take 200 mg by mouth daily.      No  current facility-administered medications for this visit.     Allergies as of 04/24/2018 - Review Complete 04/24/2018  Allergen Reaction Noted  . Penicillins Nausea And Vomiting 01/30/2018    Past Medical History:  Diagnosis Date  . Bipolar 1 disorder ([place])   . DDD (degenerative disc disease), lumbar    cervical  . Depression   . Fibromyalgia   . Occipital neuralgia     Past Surgical History:  Procedure Laterality Date  . CESAREAN SECTION     2009  . PLACEMENT OF BREAST IMPLANTS Bilateral    1999    Family History  Problem Relation Age of Onset  . Multiple sclerosis Mother   . Tuberculosis Father   . Kidney disease Father   . Mental illness Sister   . Drug abuse Sister   . Diabetes Maternal Grandmother   . Stroke Paternal Grandmother   . Heart Problems Paternal Grandfather   . Breast cancer Sister   . Mental illness Sister   . Drug abuse Sister   . Alcoholism Sister   . Mental illness Sister   . Drug abuse Sister   . Mental illness Son     Social History   Socioeconomic History  . Marital status: Legally Separated    Spouse name: Not on file  . Number of  children: 5  . Years of education: Not on file  . Highest education level: 12th grade  Occupational History  . Occupation: disabled  Social Needs  . Financial resource strain: Not hard at all  . Food insecurity:    Worry: Never true    Inability: Never true  . Transportation needs:    Medical: No    Non-medical: No  Tobacco Use  . Smoking status: Current Every Day Smoker    Packs/day: 0.00    Years: 30.00    Pack years: 0.00    Types: Cigarettes  . Smokeless tobacco: Never Used  . Tobacco comment: smokes 10 cig daily  Substance and Sexual Activity  . Alcohol use: No    Frequency: Never  . Drug use: No  . Sexual activity: Not Currently    Birth control/protection: None  Lifestyle  . Physical activity:    Days per week: 5 days    Minutes per session: 20 min  . Stress: Rather much   Relationships  . Social connections:    Talks on phone: Never    Gets together: Never    Attends religious service: Never    Active member of club or organization: No    Attends meetings of clubs or organizations: Never    Relationship status: Separated  . Intimate partner violence:    Fear of current or ex partner: No    Emotionally abused: No    Physically abused: No    Forced sexual activity: No  Other Topics Concern  . Not on file  Social History Narrative   Disabled   Has 5 children , 3 still live at home.      ROS:  General: Negative for anorexia, weight loss, fever, chills, fatigue, weakness. Eyes: Negative for vision changes.  ENT: Negative for hoarseness, difficulty swallowing , nasal congestion. CV: Negative for chest pain, angina, palpitations, dyspnea on exertion, peripheral edema.  Respiratory: Negative for dyspnea at rest, dyspnea on exertion, cough, sputum, wheezing.  GI: See history of present illness. GU:  Negative for dysuria, hematuria, urinary incontinence, urinary frequency, nocturnal urination.  MS: +joint and neck pain.  Derm: Negative for rash or itching.  Neuro: Negative for weakness, abnormal sensation, seizure, frequent headaches, memory loss, confusion.  Psych: Negative for anxiety, depression, suicidal ideation, hallucinations.  Endo: Negative for unusual weight change.  Heme: Negative for bruising or bleeding. Allergy: Negative for rash or hives.    Physical Examination:  BP 124/76   Pulse (!) 115   Temp 98.8 F (37.1 C) (Oral)   Ht 4\' 11"  (1.499 m)   Wt 166 lb (75.3 kg)   LMP 03/28/2018 (Exact Date)   BMI 33.53 kg/m    General: Well-nourished, well-developed in no acute distress.  Head: Normocephalic, atraumatic.   Eyes: Conjunctiva pink, no icterus. Mouth: Oropharyngeal mucosa moist and pink , no lesions erythema or exudate. Neck: Supple without thyromegaly, masses, or lymphadenopathy.  Lungs: Clear to auscultation bilaterally.   Heart: Regular rate and rhythm, no murmurs rubs or gallops.  Abdomen: Bowel sounds are normal, mild to mod epig tenderness, nondistended, no hepatosplenomegaly or masses, no abdominal bruits or    hernia , no rebound or guarding.   Rectal: not performed Extremities: No lower extremity edema. No clubbing or deformities.  Neuro: Alert and oriented x 4 , grossly normal neurologically.  Skin: Warm and dry, no rash or jaundice.   Psych: Alert and cooperative, normal mood and affect.  Labs: Lab Results  Component Value  Date   WBC 9.9 01/30/2018   HGB 11.5 (L) 01/30/2018   HCT 37.7 01/30/2018   MCV 84.3 01/30/2018   PLT 503 (H) 01/30/2018   Lab Results  Component Value Date   CREATININE 1.05 (H) 01/30/2018   BUN 12 01/30/2018   NA 143 01/30/2018   K 4.0 01/30/2018   CL 105 01/30/2018   CO2 27 01/30/2018   Lab Results  Component Value Date   FERRITIN 5 (L) 01/30/2018   12/2017: normal LFTs with PCP.  Imaging Studies: US Pelvis Transvanginal Non-ob (tv Only)  Result Date: 04/12/2018 GYNECOLOGIC SONOGRAM Susan Dougherty is a 47 y.o. [id number] LMP 03/28/2018,she is here for a pelvic sonogram for menorrhagia. Uterus                      9.1 x 3.7 x 5.6 cm, vol 99 ml,homogeneous anteverted uterus,wnl Endometrium          6.4 mm, symmetrical, echogenic endometrial polyp w/color flow 2 x .5 x 1.2 cm Right ovary             1.8 x 1.4 x 1.7 cm, wnl Left ovary                2.1 x 1.2 x 1.7 cm, wnl No free fluid Technician Comments: PELVIC US TA/TV: homogeneous anteverted uterus,wnl,EEC 6.4 mm,echogenic endometrial polyp w/color flow 2 x .5 x 1.2 cm,normal ovaries bilat,no free fluid,ovaries appear mobile,no pain during ultrasound [employer] 04/08/2018 1:41 PM Clinical Impression and recommendations: I have reviewed the sonogram results above. Combined with the patient's current clinical course, below are my impressions and any appropriate recommendations for management based on the sonographic  findings: 1 small endometrial polyp in an otherwise small uterus with normal myometrium. 2 as pt is symptomatic, IE, heavy bleeding, treatment is considered appropriate. 3 pt would be a candidate for hysteroscopic removal, and could be considered for simultaneous endometrial ablation if pt interested after counselling. 4 normal adnexa Jonnie Kind   US Pelvis (transabdominal Only)  Result Date: 04/12/2018 GYNECOLOGIC SONOGRAM Susan Dougherty is a 47 y.o. [id number] LMP 03/28/2018,she is here for a pelvic sonogram for menorrhagia. Uterus                      9.1 x 3.7 x 5.6 cm, vol 99 ml,homogeneous anteverted uterus,wnl Endometrium          6.4 mm, symmetrical, echogenic endometrial polyp w/color flow 2 x .5 x 1.2 cm Right ovary             1.8 x 1.4 x 1.7 cm, wnl Left ovary                2.1 x 1.2 x 1.7 cm, wnl No free fluid Technician Comments: PELVIC US TA/TV: homogeneous anteverted uterus,wnl,EEC 6.4 mm,echogenic endometrial polyp w/color flow 2 x .5 x 1.2 cm,normal ovaries bilat,no free fluid,ovaries appear mobile,no pain during ultrasound [employer] 04/08/2018 1:41 PM Clinical Impression and recommendations: I have reviewed the sonogram results above. Combined with the patient's current clinical course, below are my impressions and any appropriate recommendations for management based on the sonographic findings: 1 small endometrial polyp in an otherwise small uterus with normal myometrium. 2 as pt is symptomatic, IE, heavy bleeding, treatment is considered appropriate. 3 pt would be a candidate for hysteroscopic removal, and could be considered for simultaneous endometrial ablation if pt interested after counselling. 4 normal adnexa Jenny Reichmann  [place]

## 2018-04-24 NOTE — Telephone Encounter (Signed)
Tried to call pt to inform of pre-op appt 06/25/18 at 9:00am, no answer, LMOVM. Letter mailed.

## 2018-05-01 NOTE — Assessment & Plan Note (Addendum)
47 year old female presenting for further evaluation of iron deficiency anemia in the setting of NSAID use, epigastric pain, reflux, solid food dysphagia.  Uses over-the-counter antacids.  Gets some relief.  Suspect IDA mostly due to heavy menses however given GI symptoms she needs further evaluation with colonoscopy/EGD with dilation. Plan for deep sedation.  I have discussed the risks, alternatives, benefits with regards to but not limited to the risk of reaction to medication, bleeding, infection, perforation and the patient is agreeable to proceed. Written consent to be obtained.   Update labs for CBC, screen for celiac disease.

## 2018-05-01 NOTE — Assessment & Plan Note (Signed)
Start Amitiza 33mcg BID with food to better manage constipation especially in preparation for upcoming colonoscopy.

## 2018-05-04 NOTE — Progress Notes (Signed)
CC'D TO PCP °

## 2018-05-11 ENCOUNTER — Encounter: Payer: Self-pay | Admitting: Obstetrics and Gynecology

## 2018-05-11 ENCOUNTER — Ambulatory Visit (INDEPENDENT_AMBULATORY_CARE_PROVIDER_SITE_OTHER): Payer: Medicare Other | Admitting: Obstetrics and Gynecology

## 2018-05-11 DIAGNOSIS — N921 Excessive and frequent menstruation with irregular cycle: Secondary | ICD-10-CM | POA: Diagnosis not present

## 2018-05-11 DIAGNOSIS — Z113 Encounter for screening for infections with a predominantly sexual mode of transmission: Secondary | ICD-10-CM | POA: Diagnosis not present

## 2018-05-11 DIAGNOSIS — N840 Polyp of corpus uteri: Secondary | ICD-10-CM

## 2018-05-11 NOTE — Progress Notes (Signed)
Patient ID: Susan Dougherty, female   DOB: [birth year], 47 y.o.   MRN: [id number]  Preoperative History and Physical  Roslynn Denetria Luevanos is a 47 y.o. [id number] here for surgical management of endometrial polyp. She has irregular heavy periods. This is the last day of her period and is not heavy, period began 05/08/2018 after being 2 weeks late and notes worsening odor with her periods. She has had a c-section, but not a tubal.  No significant preoperative concerns.   Proposed surgery: Hysteroscopy and D&C   Past Medical History:  Diagnosis Date  . Bipolar 1 disorder ([place])   . DDD (degenerative disc disease), lumbar    cervical  . Depression   . Fibromyalgia   . IDA (iron deficiency anemia)   . Occipital neuralgia    Past Surgical History:  Procedure Laterality Date  . CESAREAN SECTION     2009  . PLACEMENT OF BREAST IMPLANTS Bilateral    1999   OB History  Gravida Para Term Preterm AB Living  _0 SAB TAB Ectopic Multiple Live Births          5    # Outcome Date GA Lbr Len/2nd Weight Sex Delivery Anes PTL Lv  5 Term     M CS-Unspec   LIV  4 Term     M Vag-Spont   LIV  3 Term     M Vag-Spont   LIV  2 Term     M Vag-Spont   LIV  1 Preterm     F Vag-Spont   LIV  Patient denies any other pertinent gynecologic issues.   Current Outpatient Medications on File Prior to Visit  Medication Sig Dispense Refill  . buPROPion (WELLBUTRIN XL) 300 MG 24 hr tablet Take 300 mg by mouth daily.     . divalproex (DEPAKOTE ER) 250 MG 24 hr tablet Take 250 mg by mouth daily.     Marland Kitchen gabapentin (NEURONTIN) 300 MG capsule Take 300 mg by mouth 4 (four) times daily.     . IRON CR PO Take 1 capsule by mouth daily.    Marland Kitchen lubiprostone (AMITIZA) 24 MCG capsule Take 1 capsule (24 mcg total) by mouth 2 (two) times daily with a meal. 60 capsule 5  . Multiple Vitamin (MULTIVITAMIN) tablet Take 1 tablet by mouth daily.    . Naproxen Sodium (ALEVE PO) Take by mouth as needed.    . nortriptyline (PAMELOR) 25  MG capsule Take 25 mg by mouth daily.     . sertraline (ZOLOFT) 100 MG tablet Take 200 mg by mouth daily.     . diclofenac (VOLTAREN) 75 MG EC tablet Take 75 mg by mouth daily.     . Na Sulfate-K Sulfate-Mg Sulf (SUPREP BOWEL PREP KIT) 17.5-3.13-1.6 GM/177ML SOLN Take 1 kit by mouth as directed. (Patient not taking: Reported on 05/11/2018) 1 Bottle 0  . Nutritional Supplements (DHEA PO) Take by mouth daily.    Marland Kitchen OVER THE COUNTER MEDICATION Hemp oil daily     No current facility-administered medications on file prior to visit.    Allergies  Allergen Reactions  . Penicillins Nausea And Vomiting    Social History:   reports that she has been smoking cigarettes.  She has been smoking about 0.00 packs per day for the past 30.00 years. She has never used smokeless tobacco. She reports that she does not drink alcohol or use drugs.  Family History  Problem Relation Age  of Onset  . Multiple sclerosis Mother   . Tuberculosis Father   . Kidney disease Father   . Mental illness Sister   . Drug abuse Sister   . Diabetes Maternal Grandmother   . Stroke Paternal Grandmother   . Heart Problems Paternal Grandfather   . Breast cancer Sister   . Mental illness Sister   . Drug abuse Sister   . Alcoholism Sister   . Mental illness Sister   . Drug abuse Sister   . Mental illness Son   . Colon cancer Neg Hx     Review of Systems: Noncontributory  PHYSICAL EXAM: Blood pressure 112/77, pulse (!) 117, height _0  (1.499 m), weight 167 lb 12.8 oz (76.1 kg), last menstrual period 05/08/2018. General appearance - alert, well appearing, and in no distress Chest - clear to auscultation, no wheezes, rales or rhonchi, symmetric air entry Heart - tachycardia Abdomen - soft, nontender, nondistended, no masses or organomegaly Pelvic - VULVA:normal menses heavy VAGINA: normal juicy sample collected CERVIX: normal UTERUS:anterior twice normal size ADNEXA: negative Extremities - peripheral pulses normal,  no pedal edema, no clubbing or cyanosis  Labs: No results found for this or any previous visit (from the past 336 hour(s)).  Imaging Studies: No results found.  Assessment: Patient Active Problem List   Diagnosis Date Noted  . IDA (iron deficiency anemia) 04/24/2018  . Abdominal pain, epigastric 04/24/2018  . Constipation 04/24/2018  . Esophageal dysphagia 04/24/2018  . Encounter for gynecological examination with Papanicolaou smear of cervix 03/06/2018  . Routine cervical smear 03/06/2018  . Screening for colorectal cancer 03/06/2018  . Menorrhagia with regular cycle 03/06/2018    Plan: Patient will undergo surgical  management with hysteroscopy, Dilation and curettage, with removal of.endometrial polyp.   By signing my name below, I, Samul Dada, attest that this documentation has been prepared under the direction and in the presence of Jonnie Kind, MD. Electronically Signed: [place]. 05/11/18. 12:38 PM.  I personally performed the services described in this documentation, which was SCRIBED in my presence. The recorded information has been reviewed and considered accurate. It has been edited as necessary during review. Jonnie Kind, MD

## 2018-05-13 LAB — GC/CHLAMYDIA PROBE AMP
CHLAMYDIA, DNA PROBE: NEGATIVE
NEISSERIA GONORRHOEAE BY PCR: NEGATIVE

## 2018-05-22 ENCOUNTER — Ambulatory Visit (HOSPITAL_COMMUNITY): Payer: Medicare Other | Admitting: Hematology

## 2018-05-22 ENCOUNTER — Other Ambulatory Visit (HOSPITAL_COMMUNITY): Payer: Medicare Other

## 2018-05-22 NOTE — Patient Instructions (Signed)
Susan Dougherty  05/22/2018     @PREFPERIOPPHARMACY @   Your procedure is scheduled on  06/02/2018  Report to [facility] at   [place]  A.M.  Call this number if you have problems the morning of surgery:  [phone]   Remember:  Do not eat or drink after midnight.  You may drink clear liquids until  12 midnight 06/01/2018 .  Clear liquids allowed are:                    Water, Juice (non-citric and without pulp), Carbonated beverages, Clear Tea, Black Coffee only, Plain Jell-O only, Gatorade and Plain Popsicles only    Take these medicines the morning of surgery with A SIP OF WATER wellbutrin,voltaren, depakote, gabapentin, pamelor, zoloft.    Do not wear jewelry, make-up or nail polish.  Do not wear lotions, powders, or perfumes, or deodorant.  Do not shave 48 hours prior to surgery.  Men may shave face and neck.  Do not bring valuables to the hospital.  [facility] is not responsible for any belongings or valuables.  Contacts, dentures or bridgework may not be worn into surgery.  Leave your suitcase in the car.  After surgery it may be brought to your room.  For patients admitted to the hospital, discharge time will be determined by your treatment team.  Patients discharged the day of surgery will not be allowed to drive home.   Name and phone number of your driver:   family Special instructions:  None  Please read over the following fact sheets that you were given. Anesthesia Post-op Instructions and Care and Recovery After Surgery       Dilation and Curettage or Vacuum Curettage Dilation and curettage (D&C) and vacuum curettage are minor procedures. A D&C involves stretching (dilation) the cervix and scraping (curettage) the inside lining of the uterus (endometrium). During a D&C, tissue is gently scraped from the endometrium, starting from the top portion of the uterus down to the lowest part of the uterus (cervix). During a vacuum curettage, the  lining and tissue in the uterus are removed with the use of gentle suction. Curettage may be performed to either diagnose or treat a problem. As a diagnostic procedure, curettage is performed to examine tissues from the uterus. A diagnostic curettage may be done if you have:  Irregular bleeding in the uterus.  Bleeding with the development of clots.  Spotting between menstrual periods.  Prolonged menstrual periods or other abnormal bleeding.  Bleeding after menopause.  No menstrual period (amenorrhea).  A change in size and shape of the uterus.  Abnormal endometrial cells discovered during a Pap test.  As a treatment procedure, curettage may be performed for the following reasons:  Removal of an IUD (intrauterine device).  Removal of retained placenta after giving birth.  Abortion.  Miscarriage.  Removal of endometrial polyps.  Removal of uncommon types of noncancerous lumps (fibroids).  Tell a health care provider about:  Any allergies you have, including allergies to prescribed medicine or latex.  All medicines you are taking, including vitamins, herbs, eye drops, creams, and over-the-counter medicines. This is especially important if you take any blood-thinning medicine. Bring a list of all of your medicines to your appointment.  Any problems you or family members have had with anesthetic medicines.  Any blood disorders you have.  Any surgeries you have had.  Your medical  history and any medical conditions you have.  Whether you are pregnant or may be pregnant.  Recent vaginal infections you have had.  Recent menstrual periods, bleeding problems you have had, and what form of birth control (contraception) you use. What are the risks? Generally, this is a safe procedure. However, problems may occur, including:  Infection.  Heavy vaginal bleeding.  Allergic reactions to medicines.  Damage to the cervix or other structures or organs.  Development of scar  tissue (adhesions) inside the uterus, which can cause abnormal amounts of menstrual bleeding. This may make it harder to get pregnant in the future.  A hole (perforation) or puncture in the uterine wall. This is rare.  What happens before the procedure? Staying hydrated Follow instructions from your health care provider about hydration, which may include:  Up to 2 hours before the procedure - you may continue to drink clear liquids, such as water, clear fruit juice, black coffee, and plain tea.  Eating and drinking restrictions Follow instructions from your health care provider about eating and drinking, which may include:  8 hours before the procedure - stop eating heavy meals or foods such as meat, fried foods, or fatty foods.  6 hours before the procedure - stop eating light meals or foods, such as toast or cereal.  6 hours before the procedure - stop drinking milk or drinks that contain milk.  2 hours before the procedure - stop drinking clear liquids. If your health care provider told you to take your medicine(s) on the day of your procedure, take them with only a sip of water.  Medicines  Ask your health care provider about: ? Changing or stopping your regular medicines. This is especially important if you are taking diabetes medicines or blood thinners. ? Taking medicines such as aspirin and ibuprofen. These medicines can thin your blood. Do not take these medicines before your procedure if your health care provider instructs you not to.  You may be given antibiotic medicine to help prevent infection. General instructions  For 24 hours before your procedure, do not: ? Douche. ? Use tampons. ? Use medicines, creams, or suppositories in the vagina. ? Have sexual intercourse.  You may be given a pregnancy test on the day of the procedure.  Plan to have someone take you home from the hospital or clinic.  You may have a blood or urine sample taken.  If you will be going  home right after the procedure, plan to have someone with you for 24 hours. What happens during the procedure?  To reduce your risk of infection: ? Your health care team will wash or sanitize their hands. ? Your skin will be washed with soap.  An IV tube will be inserted into one of your veins.  You will be given one of the following: ? A medicine that numbs the area in and around the cervix (local anesthetic). ? A medicine to make you fall asleep (general anesthetic).  You will lie down on your back, with your feet in foot rests (stirrups).  The size and position of your uterus will be checked.  A lubricated instrument (speculum or Sims retractor) will be inserted into the back side of your vagina. The speculum will be used to hold apart the walls of your vagina so your health care provider can see your cervix.  A tool (tenaculum) will be attached to the lip of the cervix to stabilize it.  Your cervix will be softened and dilated. This  may be done by: ? Taking a medicine. ? Having tapered dilators or thin rods (laminaria) or gradual widening instruments (tapered dilators) inserted into your cervix.  A small, sharp, curved instrument (curette) will be used to scrape a small amount of tissue or cells from the endometrium or cervical canal. In some cases, gentle suction is applied with the curette. The curette will then be removed. The cells will be taken to a lab for testing. The procedure may vary among health care providers and hospitals. What happens after the procedure?  You may have mild cramping, backache, pain, and light bleeding or spotting. You may pass small blood clots from your vagina.  You may have to wear compression stockings. These stockings help to prevent blood clots and reduce swelling in your legs.  Your blood pressure, heart rate, breathing rate, and blood oxygen level will be monitored until the medicines you were given have worn off. Summary  Dilation and  curettage (D&C) involves stretching (dilation) the cervix and scraping (curettage) the inside lining of the uterus (endometrium).  After the procedure, you may have mild cramping, backache, pain, and light bleeding or spotting. You may pass small blood clots from your vagina.  Plan to have someone take you home from the hospital or clinic. This information is not intended to replace advice given to you by your health care provider. Make sure you discuss any questions you have with your health care provider. Document Released: 11/11/2005 Document Revised: 07/28/2016 Document Reviewed: 07/28/2016 Elsevier Interactive Patient Education  2018 [employer].  Dilation and Curettage or Vacuum Curettage, Care After These instructions give you information about caring for yourself after your procedure. Your doctor may also give you more specific instructions. Call your doctor if you have any problems or questions after your procedure. Follow these instructions at home: Activity  Do not drive or use heavy machinery while taking prescription pain medicine.  For 24 hours after your procedure, avoid driving.  Take short walks often, followed by rest periods. Ask your doctor what activities are safe for you. After one or two days, you may be able to return to your normal activities.  Do not lift anything that is heavier than 10 lb (4.5 kg) until your doctor approves.  For at least 2 weeks, or as long as told by your doctor: ? Do not douche. ? Do not use tampons. ? Do not have sex. General instructions  Take over-the-counter and prescription medicines only as told by your doctor. This is very important if you take blood thinning medicine.  Do not take baths, swim, or use a hot tub until your doctor approves. Take showers instead of baths.  Wear compression stockings as told by your doctor.  It is up to you to get the results of your procedure. Ask your doctor when your results will be  ready.  Keep all follow-up visits as told by your doctor. This is important. Contact a doctor if:  You have very bad cramps that get worse or do not get better with medicine.  You have very bad pain in your belly (abdomen).  You cannot drink fluids without throwing up (vomiting).  You get pain in a different part of the area between your belly and thighs (pelvis).  You have bad-smelling discharge from your vagina.  You have a rash. Get help right away if:  You are bleeding a lot from your vagina. A lot of bleeding means soaking more than one sanitary pad in an hour,  for 2 hours in a row.  You have clumps of blood (blood clots) coming from your vagina.  You have a fever or chills.  Your belly feels very tender or hard.  You have chest pain.  You have trouble breathing.  You cough up blood.  You feel dizzy.  You feel light-headed.  You pass out (faint).  You have pain in your neck or shoulder area. Summary  Take short walks often, followed by rest periods. Ask your doctor what activities are safe for you. After one or two days, you may be able to return to your normal activities.  Do not lift anything that is heavier than 10 lb (4.5 kg) until your doctor approves.  Do not take baths, swim, or use a hot tub until your doctor approves. Take showers instead of baths.  Contact your doctor if you have any symptoms of infection, like bad-smelling discharge from your vagina. This information is not intended to replace advice given to you by your health care provider. Make sure you discuss any questions you have with your health care provider. Document Released: 08/20/2008 Document Revised: 07/29/2016 Document Reviewed: 07/29/2016 Elsevier Interactive Patient Education  2017 [place]. Hysteroscopy Hysteroscopy is a procedure used for looking inside the womb (uterus). It may be done for various reasons, including:  To evaluate abnormal bleeding, fibroid (benign,  noncancerous) tumors, polyps, scar tissue (adhesions), and possibly cancer of the uterus.  To look for lumps (tumors) and other uterine growths.  To look for causes of why a woman cannot get pregnant (infertility), causes of recurrent loss of pregnancy (miscarriages), or a lost intrauterine device (IUD).  To perform a sterilization by blocking the fallopian tubes from inside the uterus.  In this procedure, a thin, flexible tube with a tiny light and camera on the end of it (hysteroscope) is used to look inside the uterus. A hysteroscopy should be done right after a menstrual period to be sure you are not pregnant. LET [facility] CARE PROVIDER KNOW ABOUT:  Any allergies you have.  All medicines you are taking, including vitamins, herbs, eye drops, creams, and over-the-counter medicines.  Previous problems you or members of your family have had with the use of anesthetics.  Any blood disorders you have.  Previous surgeries you have had.  Medical conditions you have. RISKS AND COMPLICATIONS Generally, this is a safe procedure. However, as with any procedure, complications can occur. Possible complications include:  Putting a hole in the uterus.  Excessive bleeding.  Infection.  Damage to the cervix.  Injury to other organs.  Allergic reaction to medicines.  Too much fluid used in the uterus for the procedure.  BEFORE THE PROCEDURE  Ask your health care provider about changing or stopping any regular medicines.  Do not take aspirin or blood thinners for 1 week before the procedure, or as directed by your health care provider. These can cause bleeding.  If you smoke, do not smoke for 2 weeks before the procedure.  In some cases, a medicine is placed in the cervix the day before the procedure. This medicine makes the cervix have a larger opening (dilate). This makes it easier for the instrument to be inserted into the uterus during the procedure.  Do not eat or drink  anything for at least 8 hours before the surgery.  Arrange for someone to take you home after the procedure. PROCEDURE  You may be given a medicine to relax you (sedative). You may also be given one  of the following: ? A medicine that numbs the area around the cervix (local anesthetic). ? A medicine that makes you sleep through the procedure (general anesthetic).  The hysteroscope is inserted through the vagina into the uterus. The camera on the hysteroscope sends a picture to a TV screen. This gives the surgeon a good view inside the uterus.  During the procedure, air or a liquid is put into the uterus, which allows the surgeon to see better.  Sometimes, tissue is gently scraped from inside the uterus. These tissue samples are sent to a lab for testing. What to expect after the procedure  If you had a general anesthetic, you may be groggy for a couple hours after the procedure.  If you had a local anesthetic, you will be able to go home as soon as you are stable and feel ready.  You may have some cramping. This normally lasts for a couple days.  You may have bleeding, which varies from light spotting for a few days to menstrual-like bleeding for 3-7 days. This is normal.  If your test results are not back during the visit, make an appointment with your health care provider to find out the results. This information is not intended to replace advice given to you by your health care provider. Make sure you discuss any questions you have with your health care provider. Document Released: 02/17/2001 Document Revised: 04/18/2016 Document Reviewed: 06/10/2013 Elsevier Interactive Patient Education  2017 [place]. Hysteroscopy, Care After Refer to this sheet in the next few weeks. These instructions provide you with information on caring for yourself after your procedure. Your health care provider may also give you more specific instructions. Your treatment has been planned according to  current medical practices, but problems sometimes occur. Call your health care provider if you have any problems or questions after your procedure. What can I expect after the procedure? After your procedure, it is typical to have the following:  You may have some cramping. This normally lasts for a couple days.  You may have bleeding. This can vary from light spotting for a few days to menstrual-like bleeding for 3-7 days.  Follow these instructions at home:  Rest for the first 1-2 days after the procedure.  Only take over-the-counter or prescription medicines as directed by your health care provider. Do not take aspirin. It can increase the chances of bleeding.  Take showers instead of baths for 2 weeks or as directed by your health care provider.  Do not drive for 24 hours or as directed.  Do not drink alcohol while taking pain medicine.  Do not use tampons, douche, or have sexual intercourse for 2 weeks or until your health care provider says it is okay.  Take your temperature twice a day for 4-5 days. Write it down each time.  Follow your health care provider's advice about diet, exercise, and lifting.  If you develop constipation, you may: ? Take a mild laxative if your health care provider approves. ? Add bran foods to your diet. ? Drink enough fluids to keep your urine clear or pale yellow.  Try to have someone with you or available to you for the first 24-48 hours, especially if you were given a general anesthetic.  Follow up with your health care provider as directed. Contact a health care provider if:  You feel dizzy or lightheaded.  You feel sick to your stomach (nauseous).  You have abnormal vaginal discharge.  You have a rash.  You have pain that is not controlled with medicine. Get help right away if:  You have bleeding that is heavier than a normal menstrual period.  You have a fever.  You have increasing cramps or pain, not controlled with  medicine.  You have new belly (abdominal) pain.  You pass out.  You have pain in the tops of your shoulders (shoulder strap areas).  You have shortness of breath. This information is not intended to replace advice given to you by your health care provider. Make sure you discuss any questions you have with your health care provider. Document Released: 09/01/2013 Document Revised: 04/18/2016 Document Reviewed: 06/10/2013 Elsevier Interactive Patient Education  2017 [place].  Endometrial Ablation Endometrial ablation is a procedure that destroys the thin inner layer of the lining of the uterus (endometrium). This procedure may be done:  To stop heavy periods.  To stop bleeding that is causing anemia.  To control irregular bleeding.  To treat bleeding caused by small tumors (fibroids) in the endometrium.  This procedure is often an alternative to major surgery, such as removal of the uterus and cervix (hysterectomy). As a result of this procedure:  You may not be able to have children. However, if you are premenopausal (you have not gone through menopause): ? You may still have a small chance of getting pregnant. ? You will need to use a reliable method of birth control after the procedure to prevent pregnancy.  You may stop having a menstrual period, or you may have only a small amount of bleeding during your period. Menstruation may return several years after the procedure.  Tell a health care provider about:  Any allergies you have.  All medicines you are taking, including vitamins, herbs, eye drops, creams, and over-the-counter medicines.  Any problems you or family members have had with the use of anesthetic medicines.  Any blood disorders you have.  Any surgeries you have had.  Any medical conditions you have. What are the risks? Generally, this is a safe procedure. However, problems may occur, including:  A hole (perforation) in the uterus or  bowel.  Infection of the uterus, bladder, or vagina.  Bleeding.  Damage to other structures or organs.  An air bubble in the lung (air embolus).  Problems with pregnancy after the procedure.  Failure of the procedure.  Decreased ability to diagnose cancer in the endometrium.  What happens before the procedure?  You will have tests of your endometrium to make sure there are no pre-cancerous cells or cancer cells present.  You may have an ultrasound of the uterus.  You may be given medicines to thin the endometrium.  Ask your health care provider about: ? Changing or stopping your regular medicines. This is especially important if you take diabetes medicines or blood thinners. ? Taking medicines such as aspirin and ibuprofen. These medicines can thin your blood. Do not take these medicines before your procedure if your doctor tells you not to.  Plan to have someone take you home from the hospital or clinic. What happens during the procedure?  You will lie on an exam table with your feet and legs supported as in a pelvic exam.  To lower your risk of infection: ? Your health care team will wash or sanitize their hands and put on germ-free (sterile) gloves. ? Your genital area will be washed with soap.  An IV tube will be inserted into one of your veins.  You will be given a medicine to  help you relax (sedative).  A surgical instrument with a light and camera (resectoscope) will be inserted into your vagina and moved into your uterus. This allows your surgeon to see inside your uterus.  Endometrial tissue will be removed using one of the following methods: ? Radiofrequency. This method uses a radiofrequency-alternating electric current to remove the endometrium. ? Cryotherapy. This method uses extreme cold to freeze the endometrium. ? Heated-free liquid. This method uses a heated saltwater (saline) solution to remove the endometrium. ? Microwave. This method uses high-energy  microwaves to heat up the endometrium and remove it. ? Thermal balloon. This method involves inserting a catheter with a balloon tip into the uterus. The balloon tip is filled with heated fluid to remove the endometrium. The procedure may vary among health care providers and hospitals. What happens after the procedure?  Your blood pressure, heart rate, breathing rate, and blood oxygen level will be monitored until the medicines you were given have worn off.  As tissue healing occurs, you may notice vaginal bleeding for 4-6 weeks after the procedure. You may also experience: ? Cramps. ? Thin, watery vaginal discharge that is light pink or brown in color. ? A need to urinate more frequently than usual. ? Nausea.  Do not drive for 24 hours if you were given a sedative.  Do not have sex or insert anything into your vagina until your health care provider approves. Summary  Endometrial ablation is done to treat the many causes of heavy menstrual bleeding.  The procedure may be done only after medications have been tried to control the bleeding.  Plan to have someone take you home from the hospital or clinic. This information is not intended to replace advice given to you by your health care provider. Make sure you discuss any questions you have with your health care provider. Document Released: 09/20/2004 Document Revised: 11/28/2016 Document Reviewed: 11/28/2016 Elsevier Interactive Patient Education  2017 [place] Anesthesia, Adult General anesthesia is the use of medicines to make a person "go to sleep" (be unconscious) for a medical procedure. General anesthesia is often recommended when a procedure:  Is long.  Requires you to be still or in an unusual position.  Is major and can cause you to lose blood.  Is impossible to do without general anesthesia.  The medicines used for general anesthesia are called general anesthetics. In addition to making you sleep, the  medicines:  Prevent pain.  Control your blood pressure.  Relax your muscles.  Tell a health care provider about:  Any allergies you have.  All medicines you are taking, including vitamins, herbs, eye drops, creams, and over-the-counter medicines.  Any problems you or family members have had with anesthetic medicines.  Types of anesthetics you have had in the past.  Any bleeding disorders you have.  Any surgeries you have had.  Any medical conditions you have.  Any history of heart or lung conditions, such as heart failure, sleep apnea, or chronic obstructive pulmonary disease (COPD).  Whether you are pregnant or may be pregnant.  Whether you use tobacco, alcohol, marijuana, or street drugs.  Any history of Armed forces logistics/support/administrative officer.  Any history of depression or anxiety. What are the risks? Generally, this is a safe procedure. However, problems may occur, including:  Allergic reaction to anesthetics.  Lung and heart problems.  Inhaling food or liquids from your stomach into your lungs (aspiration).  Injury to nerves.  Waking up during your procedure and being unable to  move (rare).  Extreme agitation or a state of mental confusion (delirium) when you wake up from the anesthetic.  Air in the bloodstream, which can lead to stroke.  These problems are more likely to develop if you are having a major surgery or if you have an advanced medical condition. You can prevent some of these complications by answering all of your health care provider's questions thoroughly and by following all pre-procedure instructions. General anesthesia can cause side effects, including:  Nausea or vomiting  A sore throat from the breathing tube.  Feeling cold or shivery.  Feeling tired, washed out, or achy.  Sleepiness or drowsiness.  Confusion or agitation.  What happens before the procedure? Staying hydrated Follow instructions from your health care provider about hydration, which  may include:  Up to 2 hours before the procedure - you may continue to drink clear liquids, such as water, clear fruit juice, black coffee, and plain tea.  Eating and drinking restrictions Follow instructions from your health care provider about eating and drinking, which may include:  8 hours before the procedure - stop eating heavy meals or foods such as meat, fried foods, or fatty foods.  6 hours before the procedure - stop eating light meals or foods, such as toast or cereal.  6 hours before the procedure - stop drinking milk or drinks that contain milk.  2 hours before the procedure - stop drinking clear liquids.  Medicines  Ask your health care provider about: ? Changing or stopping your regular medicines. This is especially important if you are taking diabetes medicines or blood thinners. ? Taking medicines such as aspirin and ibuprofen. These medicines can thin your blood. Do not take these medicines before your procedure if your health care provider instructs you not to. ? Taking new dietary supplements or medicines. Do not take these during the week before your procedure unless your health care provider approves them.  If you are told to take a medicine or to continue taking a medicine on the day of the procedure, take the medicine with sips of water. General instructions   Ask if you will be going home the same day, the following day, or after a longer hospital stay. ? Plan to have someone take you home. ? Plan to have someone stay with you for the first 24 hours after you leave the hospital or clinic.  For 3-6 weeks before the procedure, try not to use any tobacco products, such as cigarettes, chewing tobacco, and e-cigarettes.  You may brush your teeth on the morning of the procedure, but make sure to spit out the toothpaste. What happens during the procedure?  You will be given anesthetics through a mask and through an IV tube in one of your veins.  You may receive  medicine to help you relax (sedative).  As soon as you are asleep, a breathing tube may be used to help you breathe.  An anesthesia specialist will stay with you throughout the procedure. He or she will help keep you comfortable and safe by continuing to give you medicines and adjusting the amount of medicine that you get. He or she will also watch your blood pressure, pulse, and oxygen levels to make sure that the anesthetics do not cause any problems.  If a breathing tube was used to help you breathe, it will be removed before you wake up. The procedure may vary among health care providers and hospitals. What happens after the procedure?  You will wake  up, often slowly, after the procedure is complete, usually in a recovery area.  Your blood pressure, heart rate, breathing rate, and blood oxygen level will be monitored until the medicines you were given have worn off.  You may be given medicine to help you calm down if you feel anxious or agitated.  If you will be going home the same day, your health care provider may check to make sure you can stand, drink, and urinate.  Your health care providers will treat your pain and side effects before you go home.  Do not drive for 24 hours if you received a sedative.  You may: ? Feel nauseous and vomit. ? Have a sore throat. ? Have mental slowness. ? Feel cold or shivery. ? Feel sleepy. ? Feel tired. ? Feel sore or achy, even in parts of your body where you did not have surgery. This information is not intended to replace advice given to you by your health care provider. Make sure you discuss any questions you have with your health care provider. Document Released: 02/18/2008 Document Revised: 04/23/2016 Document Reviewed: 10/26/2015 Elsevier Interactive Patient Education  2018 [employer] Anesthesia, Adult, Care After These instructions provide you with information about caring for yourself after your procedure. Your health  care provider may also give you more specific instructions. Your treatment has been planned according to current medical practices, but problems sometimes occur. Call your health care provider if you have any problems or questions after your procedure. What can I expect after the procedure? After the procedure, it is common to have:  Vomiting.  A sore throat.  Mental slowness.  It is common to feel:  Nauseous.  Cold or shivery.  Sleepy.  Tired.  Sore or achy, even in parts of your body where you did not have surgery.  Follow these instructions at home: For at least 24 hours after the procedure:  Do not: ? Participate in activities where you could fall or become injured. ? Drive. ? Use heavy machinery. ? Drink alcohol. ? Take sleeping pills or medicines that cause drowsiness. ? Make important decisions or sign legal documents. ? Take care of children on your own.  Rest. Eating and drinking  If you vomit, drink water, juice, or soup when you can drink without vomiting.  Drink enough fluid to keep your urine clear or pale yellow.  Make sure you have little or no nausea before eating solid foods.  Follow the diet recommended by your health care provider. General instructions  Have a responsible adult stay with you until you are awake and alert.  Return to your normal activities as told by your health care provider. Ask your health care provider what activities are safe for you.  Take over-the-counter and prescription medicines only as told by your health care provider.  If you smoke, do not smoke without supervision.  Keep all follow-up visits as told by your health care provider. This is important. Contact a health care provider if:  You continue to have nausea or vomiting at home, and medicines are not helpful.  You cannot drink fluids or start eating again.  You cannot urinate after 8-12 hours.  You develop a skin rash.  You have fever.  You have  increasing redness at the site of your procedure. Get help right away if:  You have difficulty breathing.  You have chest pain.  You have unexpected bleeding.  You feel that you are having a life-threatening or urgent problem. This  information is not intended to replace advice given to you by your health care provider. Make sure you discuss any questions you have with your health care provider. Document Released: 02/17/2001 Document Revised: 04/15/2016 Document Reviewed: 10/26/2015 Elsevier Interactive Patient Education  [employer].

## 2018-05-24 ENCOUNTER — Other Ambulatory Visit: Payer: Self-pay | Admitting: Obstetrics and Gynecology

## 2018-05-24 ENCOUNTER — Telehealth: Payer: Self-pay | Admitting: Obstetrics and Gynecology

## 2018-05-27 ENCOUNTER — Encounter (HOSPITAL_COMMUNITY)
Admission: RE | Admit: 2018-05-27 | Discharge: 2018-05-27 | Disposition: A | Discharge status: Home / Self Care | Payer: Medicare Other | Source: Ambulatory Visit | Attending: Obstetrics and Gynecology | Admitting: Obstetrics and Gynecology

## 2018-05-27 ENCOUNTER — Encounter (HOSPITAL_COMMUNITY): Payer: Self-pay

## 2018-05-27 ENCOUNTER — Other Ambulatory Visit: Payer: Self-pay

## 2018-05-27 DIAGNOSIS — Z01812 Encounter for preprocedural laboratory examination: Secondary | ICD-10-CM | POA: Diagnosis not present

## 2018-05-27 DIAGNOSIS — Z01810 Encounter for preprocedural cardiovascular examination: Secondary | ICD-10-CM | POA: Diagnosis not present

## 2018-05-27 HISTORY — DX: Essential (primary) hypertension: I10

## 2018-05-27 LAB — CBC
HEMATOCRIT: 43.5 % (ref 36.0–46.0)
HEMOGLOBIN: 13.8 g/dL (ref 12.0–15.0)
MCH: 28.6 pg (ref 26.0–34.0)
MCHC: 31.7 g/dL (ref 30.0–36.0)
MCV: 90.2 fL (ref 78.0–100.0)
Platelets: 413 10*3/uL — ABNORMAL HIGH (ref 150–400)
RBC: 4.82 MIL/uL (ref 3.87–5.11)
RDW: 15 % (ref 11.5–15.5)
WBC: 11.9 10*3/uL — AB (ref 4.0–10.5)

## 2018-05-27 LAB — COMPREHENSIVE METABOLIC PANEL
ALBUMIN: 4.1 g/dL (ref 3.5–5.0)
ALK PHOS: 95 U/L (ref 38–126)
ALT: 59 U/L — AB (ref 0–44)
ANION GAP: 10 (ref 5–15)
AST: 44 U/L — AB (ref 15–41)
BUN: 16 mg/dL (ref 6–20)
CALCIUM: 9 mg/dL (ref 8.9–10.3)
CO2: 26 mmol/L (ref 22–32)
CREATININE: 0.9 mg/dL (ref 0.44–1.00)
Chloride: 103 mmol/L (ref 98–111)
GFR calc Af Amer: >60 mL/min (ref >60)
GFR calc non Af Amer: >60 mL/min (ref >60)
GLUCOSE: 82 mg/dL (ref 70–99)
Potassium: 3.7 mmol/L (ref 3.5–5.1)
SODIUM: 139 mmol/L (ref 135–145)
Total Bilirubin: 0.4 mg/dL (ref 0.3–1.2)
Total Protein: 7.9 g/dL (ref 6.5–8.1)

## 2018-05-27 LAB — HCG, SERUM, QUALITATIVE: Preg, Serum: NEGATIVE

## 2018-05-29 ENCOUNTER — Inpatient Hospital Stay (HOSPITAL_COMMUNITY): Payer: Medicare Other | Attending: Hematology

## 2018-05-29 ENCOUNTER — Other Ambulatory Visit (HOSPITAL_COMMUNITY)
Admission: RE | Admit: 2018-05-29 | Discharge: 2018-05-29 | Disposition: A | Discharge status: Home / Self Care | Payer: Medicare Other | Source: Ambulatory Visit | Attending: Gastroenterology | Admitting: Gastroenterology

## 2018-05-29 DIAGNOSIS — N920 Excessive and frequent menstruation with regular cycle: Secondary | ICD-10-CM | POA: Diagnosis not present

## 2018-05-29 DIAGNOSIS — R7989 Other specified abnormal findings of blood chemistry: Secondary | ICD-10-CM | POA: Diagnosis not present

## 2018-05-29 DIAGNOSIS — K5900 Constipation, unspecified: Secondary | ICD-10-CM | POA: Diagnosis not present

## 2018-05-29 DIAGNOSIS — Z01812 Encounter for preprocedural laboratory examination: Secondary | ICD-10-CM | POA: Diagnosis not present

## 2018-05-29 DIAGNOSIS — D508 Other iron deficiency anemias: Secondary | ICD-10-CM | POA: Insufficient documentation

## 2018-05-29 DIAGNOSIS — Z803 Family history of malignant neoplasm of breast: Secondary | ICD-10-CM | POA: Insufficient documentation

## 2018-05-29 DIAGNOSIS — R10819 Abdominal tenderness, unspecified site: Secondary | ICD-10-CM | POA: Insufficient documentation

## 2018-05-29 DIAGNOSIS — R110 Nausea: Secondary | ICD-10-CM | POA: Diagnosis not present

## 2018-05-29 DIAGNOSIS — F17210 Nicotine dependence, cigarettes, uncomplicated: Secondary | ICD-10-CM | POA: Insufficient documentation

## 2018-05-29 DIAGNOSIS — D500 Iron deficiency anemia secondary to blood loss (chronic): Secondary | ICD-10-CM

## 2018-05-29 DIAGNOSIS — F329 Major depressive disorder, single episode, unspecified: Secondary | ICD-10-CM | POA: Diagnosis not present

## 2018-05-29 DIAGNOSIS — Z01810 Encounter for preprocedural cardiovascular examination: Secondary | ICD-10-CM | POA: Diagnosis not present

## 2018-05-29 LAB — CBC WITH DIFFERENTIAL/PLATELET
BASOS ABS: 0 10*3/uL (ref 0.0–0.1)
BASOS ABS: 0 10*3/uL (ref 0.0–0.1)
Basophils Relative: 0 %
Basophils Relative: 0 %
EOS ABS: 0.4 10*3/uL (ref 0.0–0.7)
EOS ABS: 0.5 10*3/uL (ref 0.0–0.7)
EOS PCT: 4 %
Eosinophils Relative: 4 %
HCT: 42.1 % (ref 36.0–46.0)
HEMATOCRIT: 42.4 % (ref 36.0–46.0)
HEMOGLOBIN: 13.8 g/dL (ref 12.0–15.0)
Hemoglobin: 13.6 g/dL (ref 12.0–15.0)
Lymphocytes Relative: 28 %
Lymphocytes Relative: 30 %
Lymphs Abs: 3.2 10*3/uL (ref 0.7–4.0)
Lymphs Abs: 3.6 10*3/uL (ref 0.7–4.0)
MCH: 29.5 pg (ref 26.0–34.0)
MCH: 29.2 pg (ref 26.0–34.0)
MCHC: 32.5 g/dL (ref 30.0–36.0)
MCHC: 32.3 g/dL (ref 30.0–36.0)
MCV: 90.5 fL (ref 78.0–100.0)
MCV: 90.6 fL (ref 78.0–100.0)
Monocytes Absolute: 0.6 10*3/uL (ref 0.1–1.0)
Monocytes Absolute: 0.6 10*3/uL (ref 0.1–1.0)
Monocytes Relative: 5 %
Monocytes Relative: 6 %
NEUTROS ABS: 7.2 10*3/uL (ref 1.7–7.7)
NEUTROS PCT: 61 %
NEUTROS PCT: 62 %
Neutro Abs: 7.4 10*3/uL (ref 1.7–7.7)
Platelets: 420 10*3/uL — ABNORMAL HIGH (ref 150–400)
Platelets: 424 10*3/uL — ABNORMAL HIGH (ref 150–400)
RBC: 4.65 MIL/uL (ref 3.87–5.11)
RBC: 4.68 MIL/uL (ref 3.87–5.11)
RDW: 14.9 % (ref 11.5–15.5)
RDW: 14.9 % (ref 11.5–15.5)
WBC: 11.5 10*3/uL — AB (ref 4.0–10.5)
WBC: 12 10*3/uL — AB (ref 4.0–10.5)

## 2018-05-29 LAB — COMPREHENSIVE METABOLIC PANEL
ALK PHOS: 89 U/L (ref 38–126)
ALT: 61 U/L — ABNORMAL HIGH (ref 0–44)
ANION GAP: 11 (ref 5–15)
AST: 48 U/L — ABNORMAL HIGH (ref 15–41)
Albumin: 4.3 g/dL (ref 3.5–5.0)
BUN: 14 mg/dL (ref 6–20)
CALCIUM: 9 mg/dL (ref 8.9–10.3)
CO2: 27 mmol/L (ref 22–32)
Chloride: 102 mmol/L (ref 98–111)
Creatinine, Ser: 0.99 mg/dL (ref 0.44–1.00)
GFR calc non Af Amer: >60 mL/min (ref >60)
Glucose, Bld: 169 mg/dL — ABNORMAL HIGH (ref 70–99)
POTASSIUM: 3.8 mmol/L (ref 3.5–5.1)
SODIUM: 140 mmol/L (ref 135–145)
TOTAL PROTEIN: 7.9 g/dL (ref 6.5–8.1)
Total Bilirubin: 0.4 mg/dL (ref 0.3–1.2)

## 2018-05-29 LAB — FERRITIN: FERRITIN: 28 ng/mL (ref 11–307)

## 2018-05-29 LAB — LACTATE DEHYDROGENASE: LDH: 144 U/L (ref 98–192)

## 2018-05-30 LAB — IGA: IGA: 216 mg/dL (ref 87–352)

## 2018-05-30 LAB — TISSUE TRANSGLUTAMINASE, IGA: Tissue Transglutaminase Ab, IgA: <2 U/mL (ref 0–3)

## 2018-06-02 ENCOUNTER — Ambulatory Visit (HOSPITAL_COMMUNITY): Payer: Medicare Other | Admitting: Anesthesiology

## 2018-06-02 ENCOUNTER — Ambulatory Visit (HOSPITAL_COMMUNITY)
Admission: RE | Admit: 2018-06-02 | Discharge: 2018-06-02 | Disposition: A | Discharge status: Home / Self Care | Payer: Medicare Other | Source: Ambulatory Visit | Attending: Obstetrics and Gynecology | Admitting: Obstetrics and Gynecology

## 2018-06-02 ENCOUNTER — Encounter (HOSPITAL_COMMUNITY)
Admission: RE | Disposition: A | Discharge status: Home / Self Care | Payer: Self-pay | Source: Ambulatory Visit | Attending: Obstetrics and Gynecology

## 2018-06-02 ENCOUNTER — Encounter (HOSPITAL_COMMUNITY): Payer: Self-pay

## 2018-06-02 DIAGNOSIS — Z79899 Other long term (current) drug therapy: Secondary | ICD-10-CM | POA: Insufficient documentation

## 2018-06-02 DIAGNOSIS — Z880 Allergy status to penicillin: Secondary | ICD-10-CM | POA: Insufficient documentation

## 2018-06-02 DIAGNOSIS — N920 Excessive and frequent menstruation with regular cycle: Secondary | ICD-10-CM | POA: Insufficient documentation

## 2018-06-02 DIAGNOSIS — D509 Iron deficiency anemia, unspecified: Secondary | ICD-10-CM | POA: Diagnosis not present

## 2018-06-02 DIAGNOSIS — M797 Fibromyalgia: Secondary | ICD-10-CM | POA: Diagnosis not present

## 2018-06-02 DIAGNOSIS — M5136 Other intervertebral disc degeneration, lumbar region: Secondary | ICD-10-CM | POA: Diagnosis not present

## 2018-06-02 DIAGNOSIS — F17210 Nicotine dependence, cigarettes, uncomplicated: Secondary | ICD-10-CM | POA: Diagnosis not present

## 2018-06-02 DIAGNOSIS — F319 Bipolar disorder, unspecified: Secondary | ICD-10-CM | POA: Diagnosis not present

## 2018-06-02 DIAGNOSIS — N840 Polyp of corpus uteri: Secondary | ICD-10-CM | POA: Diagnosis not present

## 2018-06-02 HISTORY — PX: CERVICAL POLYPECTOMY: SHX88

## 2018-06-02 HISTORY — PX: DILITATION & CURRETTAGE/HYSTROSCOPY WITH NOVASURE ABLATION: SHX5568

## 2018-06-02 SURGERY — DILATATION & CURETTAGE/HYSTEROSCOPY WITH NOVASURE ABLATION
Anesthesia: General | Site: Vagina

## 2018-06-02 MED ORDER — LACTATED RINGERS IV SOLN
INTRAVENOUS | Status: DC
  Administered 2018-06-02: 08:00:00 via INTRAVENOUS

## 2018-06-02 MED ORDER — MIDAZOLAM HCL 2 MG/2ML IJ SOLN
INTRAMUSCULAR | Status: AC
  Filled 2018-06-02: qty 2

## 2018-06-02 MED ORDER — 0.9 % SODIUM CHLORIDE (POUR BTL) OPTIME
TOPICAL | Status: DC | PRN
  Administered 2018-06-02: 1000 mL

## 2018-06-02 MED ORDER — ONDANSETRON HCL 4 MG/2ML IJ SOLN
INTRAMUSCULAR | Status: AC
  Filled 2018-06-02: qty 2

## 2018-06-02 MED ORDER — LIDOCAINE HCL (CARDIAC) PF 100 MG/5ML IV SOSY
PREFILLED_SYRINGE | INTRAVENOUS | Status: DC | PRN
  Administered 2018-06-02: 50 mg via INTRATRACHEAL

## 2018-06-02 MED ORDER — ONDANSETRON HCL 4 MG/2ML IJ SOLN
INTRAMUSCULAR | Status: DC | PRN
  Administered 2018-06-02: 4 mg via INTRAVENOUS

## 2018-06-02 MED ORDER — CIPROFLOXACIN IN D5W 400 MG/200ML IV SOLN
400.0000 mg | INTRAVENOUS | Status: AC
  Administered 2018-06-02: 400 mg via INTRAVENOUS
  Filled 2018-06-02: qty 200

## 2018-06-02 MED ORDER — HYDROCODONE-ACETAMINOPHEN 5-325 MG PO TABS: 1.0000 | ORAL_TABLET | Freq: Four times a day (QID) | ORAL | 0 refills | Status: AC | PRN

## 2018-06-02 MED ORDER — PROPOFOL 10 MG/ML IV BOLUS
INTRAVENOUS | Status: DC | PRN
  Administered 2018-06-02: 200 mg via INTRAVENOUS

## 2018-06-02 MED ORDER — MIDAZOLAM HCL 2 MG/2ML IJ SOLN
INTRAMUSCULAR | Status: DC | PRN
  Administered 2018-06-02: 1 mg via INTRAVENOUS

## 2018-06-02 MED ORDER — CLINDAMYCIN PHOSPHATE 900 MG/50ML IV SOLN
900.0000 mg | INTRAVENOUS | Status: AC
  Administered 2018-06-02: 900 mg via INTRAVENOUS
  Filled 2018-06-02: qty 50

## 2018-06-02 MED ORDER — PHENYLEPHRINE HCL 10 MG/ML IJ SOLN
INTRAMUSCULAR | Status: DC | PRN
  Administered 2018-06-02 (×5): 40 ug via INTRAVENOUS

## 2018-06-02 MED ORDER — FENTANYL CITRATE (PF) 100 MCG/2ML IJ SOLN
INTRAMUSCULAR | Status: AC
  Filled 2018-06-02: qty 2

## 2018-06-02 MED ORDER — SODIUM CHLORIDE 0.9 % IR SOLN
Status: DC | PRN
  Administered 2018-06-02: 3000 mL

## 2018-06-02 MED ORDER — PROPOFOL 10 MG/ML IV BOLUS
INTRAVENOUS | Status: AC
  Filled 2018-06-02: qty 40

## 2018-06-02 MED ORDER — KETOROLAC TROMETHAMINE 10 MG PO TABS: 10.0000 mg | ORAL_TABLET | Freq: Four times a day (QID) | ORAL | 0 refills | Status: DC | PRN

## 2018-06-02 MED ORDER — LIDOCAINE HCL (PF) 1 % IJ SOLN
INTRAMUSCULAR | Status: AC
Start: 2018-06-02 — End: ?
  Filled 2018-06-02: qty 5

## 2018-06-02 MED ORDER — GLYCOPYRROLATE 0.2 MG/ML IJ SOLN
INTRAMUSCULAR | Status: AC
  Filled 2018-06-02: qty 1

## 2018-06-02 MED ORDER — FENTANYL CITRATE (PF) 100 MCG/2ML IJ SOLN
INTRAMUSCULAR | Status: DC | PRN
  Administered 2018-06-02: 50 ug via INTRAVENOUS

## 2018-06-02 MED ORDER — BUPIVACAINE-EPINEPHRINE (PF) 0.5% -1:200000 IJ SOLN
INTRAMUSCULAR | Status: DC | PRN
  Administered 2018-06-02: 20 mL via PERINEURAL

## 2018-06-02 MED ORDER — BUPIVACAINE-EPINEPHRINE (PF) 0.5% -1:200000 IJ SOLN
INTRAMUSCULAR | Status: AC
  Filled 2018-06-02: qty 30

## 2018-06-02 SURGICAL SUPPLY — 25 items
CLOTH BEACON ORANGE TIMEOUT ST (SAFETY) ×3 IMPLANT
COVER LIGHT HANDLE STERIS (MISCELLANEOUS) ×6 IMPLANT
DECANTER SPIKE VIAL GLASS SM (MISCELLANEOUS) ×3 IMPLANT
GAUZE SPONGE 4X4 12PLY STRL (GAUZE/BANDAGES/DRESSINGS) ×3 IMPLANT
GLOVE BIOGEL PI IND STRL 7.0 (GLOVE) ×2 IMPLANT
GLOVE BIOGEL PI IND STRL 9 (GLOVE) ×1 IMPLANT
GLOVE BIOGEL PI INDICATOR 7.0 (GLOVE) ×4
GLOVE BIOGEL PI INDICATOR 9 (GLOVE) ×2
GLOVE ECLIPSE 6.5 STRL STRAW (GLOVE) ×3 IMPLANT
GLOVE ECLIPSE 9.0 STRL (GLOVE) ×3 IMPLANT
GOWN SPEC L3 XXLG W/TWL (GOWN DISPOSABLE) ×3 IMPLANT
GOWN STRL REUS W/TWL LRG LVL3 (GOWN DISPOSABLE) ×3 IMPLANT
HANDPIECE ABLA MINERVA ENDO (MISCELLANEOUS) ×3 IMPLANT
INST SET HYSTEROSCOPY (KITS) ×3 IMPLANT
IV NS IRRIG 3000ML ARTHROMATIC (IV SOLUTION) ×3 IMPLANT
KIT TURNOVER CYSTO (KITS) ×3 IMPLANT
KIT TURNOVER KIT A (KITS) ×3 IMPLANT
MANIFOLD NEPTUNE II (INSTRUMENTS) ×3 IMPLANT
NS IRRIG 1000ML POUR BTL (IV SOLUTION) ×3 IMPLANT
PACK PERI GYN (CUSTOM PROCEDURE TRAY) ×3 IMPLANT
PAD ARMBOARD 7.5X6 YLW CONV (MISCELLANEOUS) ×3 IMPLANT
PAD TELFA 3X4 1S STER (GAUZE/BANDAGES/DRESSINGS) ×3 IMPLANT
SET BASIN LINEN APH (SET/KITS/TRAYS/PACK) ×3 IMPLANT
SET IRRIG Y TYPE TUR BLADDER L (SET/KITS/TRAYS/PACK) ×3 IMPLANT
SYR CONTROL 10ML LL (SYRINGE) ×3 IMPLANT

## 2018-06-02 NOTE — Anesthesia Procedure Notes (Signed)
Procedure Name: LMA Insertion Date/Time: 06/02/2018 9:04 AM Performed by: Adalberto Ill, CRNA Pre-anesthesia Checklist: Patient identified, Emergency Drugs available, Patient being monitored, Suction available and Timeout performed Patient Re-evaluated:Patient Re-evaluated prior to induction Oxygen Delivery Method: Circle system utilized Preoxygenation: Pre-oxygenation with 100% oxygen Induction Type: IV induction Ventilation: Mask ventilation without difficulty LMA: LMA inserted LMA Size: 4.0 Number of attempts: 1 (brief atraumatic) Placement Confirmation: positive ETCO2 and breath sounds checked- equal and bilateral ETT to lip (cm): yes. Tube secured with: Tape Dental Injury: Teeth and Oropharynx as per pre-operative assessment

## 2018-06-02 NOTE — Anesthesia Preprocedure Evaluation (Signed)
Anesthesia Evaluation  Patient identified by MRN, date of birth, ID band Patient awake    Reviewed: Allergy & Precautions, H&P , NPO status , Patient's Chart, lab work & pertinent test results, reviewed documented beta blocker date and time   Airway Mallampati: II  TM Distance: >3 FB Neck ROM: full    Dental no notable dental hx. (+) Teeth Intact   Pulmonary neg pulmonary ROS, Current Smoker,    Pulmonary exam normal breath sounds clear to auscultation       Cardiovascular Exercise Tolerance: Good hypertension, On Medications negative cardio ROS   Rhythm:regular Rate:Normal     Neuro/Psych PSYCHIATRIC DISORDERS Depression Bipolar Disorder  Neuromuscular disease negative neurological ROS  negative psych ROS   GI/Hepatic negative GI ROS, Neg liver ROS,   Endo/Other  negative endocrine ROS  Renal/GU negative Renal ROS  negative genitourinary   Musculoskeletal   Abdominal   Peds  Hematology negative hematology ROS (+) anemia ,   Anesthesia Other Findings Depakote, Gabapentin for BiPolar DO and Fibromyalgia- NO Sz hx Tobacco abuse  Reproductive/Obstetrics negative OB ROS                             Anesthesia Physical Anesthesia Plan  ASA: II  Anesthesia Plan: General LMA   Post-op Pain Management:    Induction:   PONV Risk Score and Plan:   Airway Management Planned:   Additional Equipment:   Intra-op Plan:   Post-operative Plan:   Informed Consent: I have reviewed the patients History and Physical, chart, labs and discussed the procedure including the risks, benefits and alternatives for the proposed anesthesia with the patient or authorized representative who has indicated his/her understanding and acceptance.   Dental Advisory Given  Plan Discussed with: CRNA and Anesthesiologist  Anesthesia Plan Comments:         Anesthesia Quick Evaluation

## 2018-06-02 NOTE — Op Note (Signed)
Please see the brief operative note for surgical details 

## 2018-06-02 NOTE — Transfer of Care (Signed)
Immediate Anesthesia Transfer of Care Note  Patient: Susan Dougherty  Procedure(s) Performed: DILATATION & CURETTAGE/HYSTEROSCOPY WITH MINERVA ENDOMETRIAL ABLATION (N/A Vagina ) REMOVAL OF ENDOMETRIAL POLYP (N/A Vagina )  Patient Location: PACU  Anesthesia Type:General  Level of Consciousness: awake, alert , oriented and patient cooperative  Airway & Oxygen Therapy: Patient Spontanous Breathing and Patient connected to nasal cannula oxygen  Post-op Assessment: Report given to RN and Post -op Vital signs reviewed and stable  Post vital signs: Reviewed and stable  Last Vitals:  Vitals Value Taken Time  BP    Temp    Pulse 95 06/02/2018  9:51 AM  Resp 10 06/02/2018  9:51 AM  SpO2 98 % 06/02/2018  9:51 AM  Vitals shown include unvalidated device data.  Last Pain:  Vitals:   06/02/18 0733  TempSrc: Oral         Complications: No apparent anesthesia complications

## 2018-06-02 NOTE — Telephone Encounter (Signed)
Procedure confirmed for 06/02/18. Completed today

## 2018-06-02 NOTE — Discharge Instructions (Signed)
Dr. Glo Herring may be reached on his cell phone [phone] over the next few days if problems or concerns questions arise  Endometrial Ablation Endometrial ablation is a procedure that destroys the thin inner layer of the lining of the uterus (endometrium). This procedure may be done:  To stop heavy periods.  To stop bleeding that is causing anemia.  To control irregular bleeding.  To treat bleeding caused by small tumors (fibroids) in the endometrium.  This procedure is often an alternative to major surgery, such as removal of the uterus and cervix (hysterectomy). As a result of this procedure:  You may not be able to have children. However, if you are premenopausal (you have not gone through menopause): ? You may still have a small chance of getting pregnant. ? You will need to use a reliable method of birth control after the procedure to prevent pregnancy.  You may stop having a menstrual period, or you may have only a small amount of bleeding during your period. Menstruation may return several years after the procedure.  Tell a health care provider about:  Any allergies you have.  All medicines you are taking, including vitamins, herbs, eye drops, creams, and over-the-counter medicines.  Any problems you or family members have had with the use of anesthetic medicines.  Any blood disorders you have.  Any surgeries you have had.  Any medical conditions you have. What are the risks? Generally, this is a safe procedure. However, problems may occur, including:  A hole (perforation) in the uterus or bowel.  Infection of the uterus, bladder, or vagina.  Bleeding.  Damage to other structures or organs.  An air bubble in the lung (air embolus).  Problems with pregnancy after the procedure.  Failure of the procedure.  Decreased ability to diagnose cancer in the endometrium.  What happens before the procedure?  You will have tests of your endometrium to make sure there  are no pre-cancerous cells or cancer cells present.  You may have an ultrasound of the uterus.  You may be given medicines to thin the endometrium.  Ask your health care provider about: ? Changing or stopping your regular medicines. This is especially important if you take diabetes medicines or blood thinners. ? Taking medicines such as aspirin and ibuprofen. These medicines can thin your blood. Do not take these medicines before your procedure if your doctor tells you not to.  Plan to have someone take you home from the hospital or clinic. What happens during the procedure?  You will lie on an exam table with your feet and legs supported as in a pelvic exam.  To lower your risk of infection: ? Your health care team will wash or sanitize their hands and put on germ-free (sterile) gloves. ? Your genital area will be washed with soap.  An IV tube will be inserted into one of your veins.  You will be given a medicine to help you relax (sedative).  A surgical instrument with a light and camera (resectoscope) will be inserted into your vagina and moved into your uterus. This allows your surgeon to see inside your uterus.  Endometrial tissue will be removed using one of the following methods: ? Radiofrequency. This method uses a radiofrequency-alternating electric current to remove the endometrium. ? Cryotherapy. This method uses extreme cold to freeze the endometrium. ? Heated-free liquid. This method uses a heated saltwater (saline) solution to remove the endometrium. ? Microwave. This method uses high-energy microwaves to heat up the endometrium and  remove it. ? Thermal balloon. This method involves inserting a catheter with a balloon tip into the uterus. The balloon tip is filled with heated fluid to remove the endometrium. The procedure may vary among health care providers and hospitals. What happens after the procedure?  Your blood pressure, heart rate, breathing rate, and blood  oxygen level will be monitored until the medicines you were given have worn off.  As tissue healing occurs, you may notice vaginal bleeding for 4-6 weeks after the procedure. You may also experience: ? Cramps. ? Thin, watery vaginal discharge that is light pink or brown in color. ? A need to urinate more frequently than usual. ? Nausea.  Do not drive for 24 hours if you were given a sedative.  Do not have sex or insert anything into your vagina until your health care provider approves. Summary  Endometrial ablation is done to treat the many causes of heavy menstrual bleeding.  The procedure may be done only after medications have been tried to control the bleeding.  Plan to have someone take you home from the hospital or clinic. This information is not intended to replace advice given to you by your health care provider. Make sure you discuss any questions you have with your health care provider. Document Released: 09/20/2004 Document Revised: 11/28/2016 Document Reviewed: 11/28/2016 Elsevier Interactive Patient Education  2017 [employer].

## 2018-06-02 NOTE — Op Note (Signed)
06/02/2018  9:44 AM  PATIENT:  Susan Dougherty  47 y.o. female  PRE-OPERATIVE DIAGNOSIS:  Menorrhagia, endometrial polyp  POST-OPERATIVE DIAGNOSIS:  Menorrhagia, endometrial polyp, removed  PROCEDURE:  Procedure(s): DILATATION & CURETTAGE/HYSTEROSCOPY WITH MINERVA ENDOMETRIAL ABLATION (N/A) REMOVAL OF ENDOMETRIAL POLYP (N/A)  SURGEON:  Surgeon(s) and Role:    Jonnie Kind, MD - Primary  PHYSICIAN ASSISTANT:   ASSISTANTS: Zoila Shutter, CST  ANESTHESIA:   general and paracervical block  EBL:  25 mL   BLOOD ADMINISTERED:none  DRAINS: none   LOCAL MEDICATIONS USED:  MARCAINE    and Amount: 20 ml  SPECIMEN:  Source of Specimen:  Endometrial curettings, endometrial polyp  DISPOSITION OF SPECIMEN:  PATHOLOGY  COUNTS:  YES  TOURNIQUET:  * No tourniquets in log *  DICTATION: .Dragon Dictation  PLAN OF CARE: Discharge to home after PACU  PATIENT DISPOSITION:  PACU - hemodynamically stable.   Delay start of Pharmacological VTE agent (>24hrs) due to surgical blood loss or risk of bleeding: not applicable Details of procedure: Patient was taken the operating room prepped and draped for vaginal procedure, with timeout conducted, antibiotics administered and procedure confirmed by surgical team. The speculum was inserted the cervix grasped with single-tooth tenaculum at 12:00, paracervical block applied x20 cc of Marcaine 0.5% with epinephrine 1-200,000 dilution, and then the uterus sounded to a depth of 9-1/2 cm.  The dilation to 42 [place] was performed and then the rigid 30 degree operative hysteroscope used to identify the endometrial cavity which was intact with normal-appearing endometrium except for a small pedunculated endometrial polyp originating from the left posterior fundal portion of the endometrial cavity.  Hysteroscopic scissors were used to to amputate the stalk and the specimen was extracted and sent to histology along with the endometrial curettings.  The Minerva  endometrial ablation device was activated prepared inserted with a 5 cm cavity length, activated in the 122nd endometrial ablation sequence completed without difficulty with normal appearing eschar appearing on the outside of the Minerva balloon and removed.  The patient tolerated procedure well was allowed to awaken and go to recovery room in stable condition, where she will be given Toradol and a few Vicodin for discharge management at home.  Sponge and needle counts correct.

## 2018-06-02 NOTE — Anesthesia Postprocedure Evaluation (Signed)
Anesthesia Post Note  Patient: [name]  Procedure(s) Performed: DILATATION & CURETTAGE/HYSTEROSCOPY WITH MINERVA ENDOMETRIAL ABLATION (N/A Vagina ) REMOVAL OF ENDOMETRIAL POLYP (N/A Vagina )  Patient location during evaluation: PACU Anesthesia Type: General Level of consciousness: awake and alert and patient cooperative Pain management: satisfactory to patient Vital Signs Assessment: post-procedure vital signs reviewed and stable Respiratory status: spontaneous breathing Cardiovascular status: stable Postop Assessment: no apparent nausea or vomiting Anesthetic complications: no     Last Vitals:  Vitals:   06/02/18 1015 06/02/18 1030  BP: 113/74   Pulse: 94 89  Resp: 14 19  Temp:    SpO2: 100% 100%    Last Pain:  Vitals:   06/02/18 0949  TempSrc:   PainSc: Asleep                 Bethania Schlotzhauer

## 2018-06-02 NOTE — H&P (Signed)
Preoperative History and Physical  Susan Dougherty is a 47 y.o. [id number] here for surgical management of endometrial polyp. She has irregular heavy periods. This is the last day of her period and is not heavy, period began 05/08/2018 after being 2 weeks late and notes worsening odor with her periods. She has had a c-section, but not a tubal.  No significant preoperative concerns.   Proposed surgery: Hysteroscopy and D&C       Past Medical History:  Diagnosis Date  . Bipolar 1 disorder ([place])   . DDD (degenerative disc disease), lumbar    cervical  . Depression   . Fibromyalgia   . IDA (iron deficiency anemia)   . Occipital neuralgia         Past Surgical History:  Procedure Laterality Date  . CESAREAN SECTION     2009  . PLACEMENT OF BREAST IMPLANTS Bilateral    1999                   OB History  Gravida Para Term Preterm AB Living  '5 5 4 1   5  ' SAB TAB Ectopic Multiple Live Births             5       # Outcome Date GA Lbr Len/2nd Weight Sex Delivery Anes PTL Lv  5 Term     M CS-Unspec   LIV  4 Term     M Vag-Spont   LIV  3 Term     M Vag-Spont   LIV  2 Term     M Vag-Spont   LIV  1 Preterm     F Vag-Spont   LIV  Patient denies any other pertinent gynecologic issues.         Current Outpatient Medications on File Prior to Visit  Medication Sig Dispense Refill  . buPROPion (WELLBUTRIN XL) 300 MG 24 hr tablet Take 300 mg by mouth daily.     . divalproex (DEPAKOTE ER) 250 MG 24 hr tablet Take 250 mg by mouth daily.     Marland Kitchen gabapentin (NEURONTIN) 300 MG capsule Take 300 mg by mouth 4 (four) times daily.     . IRON CR PO Take 1 capsule by mouth daily.    Marland Kitchen lubiprostone (AMITIZA) 24 MCG capsule Take 1 capsule (24 mcg total) by mouth 2 (two) times daily with a meal. 60 capsule 5  . Multiple Vitamin (MULTIVITAMIN) tablet Take 1 tablet by mouth daily.    . Naproxen Sodium (ALEVE PO) Take by mouth as needed.    .  nortriptyline (PAMELOR) 25 MG capsule Take 25 mg by mouth daily.     . sertraline (ZOLOFT) 100 MG tablet Take 200 mg by mouth daily.     . diclofenac (VOLTAREN) 75 MG EC tablet Take 75 mg by mouth daily.     . Na Sulfate-K Sulfate-Mg Sulf (SUPREP BOWEL PREP KIT) 17.5-3.13-1.6 GM/177ML SOLN Take 1 kit by mouth as directed. (Patient not taking: Reported on 05/11/2018) 1 Bottle 0  . Nutritional Supplements (DHEA PO) Take by mouth daily.    Marland Kitchen OVER THE COUNTER MEDICATION Hemp oil daily     No current facility-administered medications on file prior to visit.        Allergies  Allergen Reactions  . Penicillins Nausea And Vomiting    Social History:   reports that she has been smoking cigarettes.  She has been smoking about 0.00 packs per day for the past 30.00 years.  She has never used smokeless tobacco. She reports that she does not drink alcohol or use drugs.       Family History  Problem Relation Age of Onset  . Multiple sclerosis Mother   . Tuberculosis Father   . Kidney disease Father   . Mental illness Sister   . Drug abuse Sister   . Diabetes Maternal Grandmother   . Stroke Paternal Grandmother   . Heart Problems Paternal Grandfather   . Breast cancer Sister   . Mental illness Sister   . Drug abuse Sister   . Alcoholism Sister   . Mental illness Sister   . Drug abuse Sister   . Mental illness Son   . Colon cancer Neg Hx     Review of Systems: Noncontributory  PHYSICAL EXAM: Blood pressure 112/77, pulse (!) 117, height '4\' 11"'  (1.499 m), weight 167 lb 12.8 oz (76.1 kg), last menstrual period 05/08/2018. General appearance - alert, well appearing, and in no distress Chest - clear to auscultation, no wheezes, rales or rhonchi, symmetric air entry Heart - tachycardia Abdomen - soft, nontender, nondistended, no masses or organomegaly Pelvic - VULVA:normal menses heavy VAGINA: normal juicy sample collected CERVIX: normal UTERUS:anterior  twice normal size ADNEXA: negative Extremities - peripheral pulses normal, no pedal edema, no clubbing or cyanosis  Labs: CBC    Component Value Date/Time   WBC 12.0 (H) 05/29/2018 1405   RBC 4.65 05/29/2018 1405   HGB 13.6 05/29/2018 1405   HCT 42.1 05/29/2018 1405   PLT 424 (H) 05/29/2018 1405   MCV 90.5 05/29/2018 1405   MCH 29.2 05/29/2018 1405   MCHC 32.3 05/29/2018 1405   RDW 14.9 05/29/2018 1405   LYMPHSABS 3.6 05/29/2018 1405   MONOABS 0.6 05/29/2018 1405   EOSABS 0.5 05/29/2018 1405   BASOSABS 0.0 05/29/2018 1405   CMP Latest Ref Rng & Units 05/29/2018 05/27/2018 01/30/2018  Glucose 70 - 99 mg/dL 169(H) 82 101(H)  BUN 6 - 20 mg/dL '14 16 12  ' Creatinine 0.44 - 1.00 mg/dL 0.99 0.90 1.05(H)  Sodium 135 - 145 mmol/L 140 139 143  Potassium 3.5 - 5.1 mmol/L 3.8 3.7 4.0  Chloride 98 - 111 mmol/L 102 103 105  CO2 22 - 32 mmol/L '27 26 27  ' Calcium 8.9 - 10.3 mg/dL 9.0 9.0 9.8  Total Protein 6.5 - 8.1 g/dL 7.9 7.9 7.9  Total Bilirubin 0.3 - 1.2 mg/dL 0.4 0.4 0.4  Alkaline Phos 38 - 126 U/L 89 95 106  AST 15 - 41 U/L 48(H) 44(H) 55(H)  ALT 0 - 44 U/L 61(H) 59(H) 76(H)     Imaging Studies: EKG normal sinus rhythm   GYNECOLOGIC SONOGRAM   Susan Dougherty is a 47 y.o. [id number] LMP 03/28/2018,she is here for a pelvic sonogram for menorrhagia.  Uterus                      9.1 x 3.7 x 5.6 cm, vol 99 ml,homogeneous anteverted uterus,wnl  Endometrium          6.4 mm, symmetrical, echogenic endometrial polyp w/color flow 2 x .5 x 1.2 cm  Right ovary             1.8 x 1.4 x 1.7 cm, wnl  Left ovary                2.1 x 1.2 x 1.7 cm, wnl  No free fluid   Technician Comments:  PELVIC US TA/TV: homogeneous anteverted uterus,wnl,EEC  6.4 mm,echogenic endometrial polyp w/color flow 2 x .5 x 1.2 cm,normal ovaries bilat,no free fluid,ovaries appear mobile,no pain during ultrasound    [employer] 04/08/2018 1:41 PM  Clinical Impression and recommendations:  I  have reviewed the sonogram results above.  Combined with the patient's current clinical course, below are my impressions and any appropriate recommendations for management based on the sonographic findings:  1 small endometrial polyp in an otherwise small uterus with normal myometrium. 2 as pt is symptomatic, IE, heavy bleeding, treatment is considered appropriate. 3 pt would be a candidate for hysteroscopic removal, and could be considered for simultaneous endometrial ablation if pt interested after counselling. 4 normal adnexa   Jonnie Kind   Assessment:     Patient Active Problem List   Diagnosis Date Noted  . IDA (iron deficiency anemia) 04/24/2018  . Abdominal pain, epigastric 04/24/2018  . Constipation 04/24/2018  . Esophageal dysphagia 04/24/2018  . Encounter for gynecological examination with Papanicolaou smear of cervix 03/06/2018  . Routine cervical smear 03/06/2018  . Screening for colorectal cancer 03/06/2018  . Menorrhagia with regular cycle 03/06/2018    Plan: Patient will undergo surgical  management with hysteroscopy, Dilation and curettage, with removal of.endometrial polyp, followed by [place] endometrial ablation.

## 2018-06-03 ENCOUNTER — Encounter (HOSPITAL_COMMUNITY): Payer: Self-pay | Admitting: Obstetrics and Gynecology

## 2018-06-05 ENCOUNTER — Ambulatory Visit (HOSPITAL_COMMUNITY): Payer: Medicare Other | Admitting: Internal Medicine

## 2018-06-10 ENCOUNTER — Other Ambulatory Visit: Payer: Self-pay

## 2018-06-10 ENCOUNTER — Ambulatory Visit (INDEPENDENT_AMBULATORY_CARE_PROVIDER_SITE_OTHER): Payer: Medicare Other | Admitting: Obstetrics and Gynecology

## 2018-06-10 ENCOUNTER — Encounter: Payer: Self-pay | Admitting: Obstetrics and Gynecology

## 2018-06-10 VITALS — BP 119/72 | HR 120 | Ht 59.0 in | Wt 169.6 lb

## 2018-06-10 DIAGNOSIS — Z09 Encounter for follow-up examination after completed treatment for conditions other than malignant neoplasm: Secondary | ICD-10-CM | POA: Diagnosis not present

## 2018-06-10 NOTE — Progress Notes (Signed)
Patient ID: Susan Dougherty, female   DOB: 1971-06-14, 47 y.o.   MRN: [id number]     Subjective:  Susan Dougherty is a 47 y.o. female now 1 weeks status post DILATATION & CURETTAGE/HYSTEROSCOPY WITH MINERVA ENDOMETRIAL ABLATION.   Susan Dougherty is having very little pain with slight cramping.  Review of Systems Negative except   Diet:   normal   Bowel movements : normal.  The patient is not having any pain.  Objective:  BP 119/72 (BP Location: Right Arm, Patient Position: Sitting, Cuff Size: Normal)   Pulse (!) 120   Ht 4\' 11"  (1.499 m)   Wt 169 lb 9.6 oz (76.9 kg)   BMI 34.26 kg/m  General:Well developed, well nourished.  No acute distress. Abdomen: Bowel sounds normal, soft, non-tender. Pelvic Exam:    External Genitalia:  Normal.    Vagina: Normal, water pink dischrge    Cervix: Normal    Uterus: Normal,    Adnexa/Bimanual: Not done  Incision(s): N/A   Assessment:  Post-Op 1 weeks s/p DILATATION & CURETTAGE/HYSTEROSCOPY WITH MINERVA ENDOMETRIAL ABLATION   Doing well postoperatively.   Plan:  1.Wound care discussed  3. Activity restrictions: no sexual activity 4. return to work: not applicable. 5. Follow up in 3 weeks.  By signing my name below, I, Samul Dada, attest that this documentation has been prepared under the direction and in the presence of Jonnie Kind, MD. Electronically Signed: [place]. 06/10/18. 10:41 AM.  I personally performed the services described in this documentation, which was SCRIBED in my presence. The recorded information has been reviewed and considered accurate. It has been edited as necessary during review. Jonnie Kind, MD

## 2018-06-11 ENCOUNTER — Encounter (HOSPITAL_COMMUNITY): Payer: Self-pay | Admitting: Internal Medicine

## 2018-06-11 ENCOUNTER — Inpatient Hospital Stay (HOSPITAL_BASED_OUTPATIENT_CLINIC_OR_DEPARTMENT_OTHER): Payer: Medicare Other | Admitting: Internal Medicine

## 2018-06-11 VITALS — BP 115/76 | HR 99 | Temp 99.1°F | Resp 14 | Wt 170.4 lb

## 2018-06-11 DIAGNOSIS — Z803 Family history of malignant neoplasm of breast: Secondary | ICD-10-CM | POA: Diagnosis not present

## 2018-06-11 DIAGNOSIS — R110 Nausea: Secondary | ICD-10-CM

## 2018-06-11 DIAGNOSIS — R10819 Abdominal tenderness, unspecified site: Secondary | ICD-10-CM | POA: Diagnosis not present

## 2018-06-11 DIAGNOSIS — K5900 Constipation, unspecified: Secondary | ICD-10-CM | POA: Diagnosis not present

## 2018-06-11 DIAGNOSIS — F329 Major depressive disorder, single episode, unspecified: Secondary | ICD-10-CM

## 2018-06-11 DIAGNOSIS — F17210 Nicotine dependence, cigarettes, uncomplicated: Secondary | ICD-10-CM | POA: Diagnosis not present

## 2018-06-11 DIAGNOSIS — D508 Other iron deficiency anemias: Secondary | ICD-10-CM | POA: Diagnosis not present

## 2018-06-11 DIAGNOSIS — R7989 Other specified abnormal findings of blood chemistry: Secondary | ICD-10-CM

## 2018-06-11 DIAGNOSIS — D500 Iron deficiency anemia secondary to blood loss (chronic): Secondary | ICD-10-CM

## 2018-06-11 DIAGNOSIS — N920 Excessive and frequent menstruation with regular cycle: Secondary | ICD-10-CM

## 2018-06-11 NOTE — Progress Notes (Signed)
Diagnosis Iron deficiency anemia due to chronic blood loss - Plan: CBC with Differential/Platelet, Comprehensive metabolic panel, Lactate dehydrogenase  Staging Cancer Staging No matching staging information was found for the patient.  Assessment and Plan:  1.  Thrombocytosis.  Labs done 05/29/2018 reviewed with pt and shows WBC 12, HB 13.6 plts 436,000.  Ferritin is improved at 28.  Pt had testing for Jak2 and BCR/ABL that was negative.  She has minimal elevation in plt count is this is felt to be a reactive process due to iron deficiency anemia.  She will return to clinic in 11/2018 for follow-up and repeat labs.    2.  Iron deficiency anemia. Labs done 05/29/2018 reviewed with pt and shows WBC 12, HB 13.6 plts 436,000.  Ferritin is improved at 28.  Ferritin was previously 5.    She reports cycles have improved since recent D and C.  She should continue iron and pt will RTC 11/2018 for repeat labs.     3.  Menorrhagia.  Patient reports cycles have improved since recent D and C.  She should continue to follow-up with GYN as directed  4.  Elevated LFTs.  It is noted ALT mildly increased at 61.  She reports she is seeing GI and PCP in next month and will likely have repeat labs.  She will discuss this with GI.  I have discussed with her possibility of medication effects and recent surgery.  If counts remain elevated could consider imaging.    5.  Family history of breast cancer.  Patient reports sister with breast cancer.  Pt should follow-up with PCP for screening mammogram.    6.  Depression.  Patient is on Wellbutrin and Zoloft.  Follow-up with PCP.    7.  Abdominal tenderness.  She had recent GYN procedures.  Pt should follow-up with GYN if symptoms fail to improve.    8. Constipation and nausea.  She is scheduled to see GI in next few weeks.  If symptoms fail to improve will consider imaging.    Greater than 35 minutes spent with more than 50% spent in counseling and coordination of care.     Interval History: Historical data obtained from the note dated 02/20/2018.  47 year old female referred for initial consultation due to thrombocytosis.  She has a history of fibromyalgia and reports diffuse joint aches.  She reports she was told she had anemia in the past approximately 10 years ago when she underwent C-section.  She has not undergone colonoscopy and has also not had mammograms performed.  Labs that were done on 01/05/2018 white count 9.6 hemoglobin 12.2 platelets 554,000 TSH was normal at 2.37 chemistries were within normal limits she had a normal alkaline phosphatase.  ANA was negative.  The patient reports her sister was recently with breast cancer.  The patient is a smoker.  Current Status:  Pt is seen today for follow-up to go over lab studies.  She reports cycles have improved since recent D and C.    Problem List Patient Active Problem List   Diagnosis Date Noted  . IDA (iron deficiency anemia) [D50.9] 04/24/2018  . Abdominal pain, epigastric [R10.13] 04/24/2018  . Constipation [K59.00] 04/24/2018  . Esophageal dysphagia [R13.10] 04/24/2018  . Encounter for gynecological examination with Papanicolaou smear of cervix [Z01.419] 03/06/2018  . Routine cervical smear [Z12.4] 03/06/2018  . Screening for colorectal cancer [Z12.11, Z12.12] 03/06/2018  . Menorrhagia with regular cycle [N92.0] 03/06/2018    Past Medical History Past Medical History:  Diagnosis Date  . Bipolar 1 disorder ([place])   . DDD (degenerative disc disease), lumbar    cervical  . Depression   . Fibromyalgia   . Hypertension   . IDA (iron deficiency anemia)   . Occipital neuralgia     Past Surgical History Past Surgical History:  Procedure Laterality Date  . CERVICAL POLYPECTOMY N/A 06/02/2018   Procedure: REMOVAL OF ENDOMETRIAL POLYP;  Surgeon: Jonnie Kind, MD;  Location: [facility];  Service: Gynecology;  Laterality: N/A;  . CESAREAN SECTION     2009  . DILITATION & CURRETTAGE/HYSTROSCOPY WITH  NOVASURE ABLATION N/A 06/02/2018   Procedure: DILATATION & CURETTAGE/HYSTEROSCOPY WITH MINERVA ENDOMETRIAL ABLATION;  Surgeon: Jonnie Kind, MD;  Location: [facility];  Service: Gynecology;  Laterality: N/A;  . PLACEMENT OF BREAST IMPLANTS Bilateral    1999    Family History Family History  Problem Relation Age of Onset  . Multiple sclerosis Mother   . Tuberculosis Father   . Kidney disease Father   . Mental illness Sister   . Drug abuse Sister   . Diabetes Maternal Grandmother   . Stroke Paternal Grandmother   . Heart Problems Paternal Grandfather   . Breast cancer Sister   . Mental illness Sister   . Drug abuse Sister   . Alcoholism Sister   . Mental illness Sister   . Drug abuse Sister   . Mental illness Son   . Colon cancer Neg Hx      Social History  reports that she has been smoking cigarettes.  She has a 15.00 pack-year smoking history. She has never used smokeless tobacco. She reports that she does not drink alcohol or use drugs.  Medications  Current Outpatient Medications:  .  buPROPion (WELLBUTRIN XL) 300 MG 24 hr tablet, Take 300 mg by mouth daily. , Disp: , Rfl:  .  Calcium Carbonate-Simethicone (ALKA-SELTZER HEARTBURN + GAS PO), Take 2 tablets by mouth daily as needed (heartburn / gas)., Disp: , Rfl:  .  divalproex (DEPAKOTE ER) 250 MG 24 hr tablet, Take 250 mg by mouth 2 (two) times daily. , Disp: , Rfl:  .  ferrous sulfate 325 (65 FE) MG tablet, Take 325 mg by mouth daily., Disp: , Rfl:  .  gabapentin (NEURONTIN) 300 MG capsule, Take 300 mg by mouth 4 (four) times daily. , Disp: , Rfl:  .  HYDROcodone-acetaminophen (NORCO/VICODIN) 5-325 MG tablet, Take 1 tablet by mouth every 6 (six) hours as needed., Disp: 15 tablet, Rfl: 0 .  Multiple Vitamin (MULTIVITAMIN) tablet, Take 1 tablet by mouth daily., Disp: , Rfl:  .  nortriptyline (PAMELOR) 25 MG capsule, Take 25 mg by mouth 2 (two) times daily. , Disp: , Rfl:  .  Nutritional Supplements (DHEA PO), Take 1 capsule  by mouth daily. , Disp: , Rfl:  .  Omega-3 Fatty Acids (FISH OIL PO), Take 1 capsule by mouth daily. Infused with hemp oil, Disp: , Rfl:  .  Polyethyl Glycol-Propyl Glycol (SYSTANE ULTRA OP), Place 1 drop into both eyes daily as needed (allergies / dry eyes)., Disp: , Rfl:  .  sertraline (ZOLOFT) 100 MG tablet, Take 100 mg by mouth 2 (two) times daily. , Disp: , Rfl:   Allergies Penicillins  Review of Systems Review of Systems - Oncology ROS negative other than constipation, nausea, abdominal pain.    Physical Exam  Vitals Wt Readings from Last 3 Encounters:  06/11/18 170 lb 6.4 oz (77.3 kg)  06/10/18 169 lb 9.6 oz (  76.9 kg)  06/02/18 169 lb (76.7 kg)   Temp Readings from Last 3 Encounters:  06/11/18 99.1 F (37.3 C) (Oral)  06/02/18 97.9 F (36.6 C)  05/27/18 98.5 F (36.9 C) (Oral)   BP Readings from Last 3 Encounters:  06/11/18 115/76  06/10/18 119/72  06/02/18 113/74   Pulse Readings from Last 3 Encounters:  06/11/18 99  06/10/18 (!) 120  06/02/18 89   Constitutional: Well-developed, well-nourished, and in no distress.   HENT: Head: Normocephalic and atraumatic.  Mouth/Throat: No oropharyngeal exudate. Mucosa moist. Eyes: Pupils are equal, round, and reactive to light. Conjunctivae are normal. No scleral icterus.  Neck: Normal range of motion. Neck supple. No JVD present.  Cardiovascular: Normal rate, regular rhythm and normal heart sounds.  Exam reveals no gallop and no friction rub.   No murmur heard. Pulmonary/Chest: Effort normal and breath sounds normal. No respiratory distress. No wheezes.No rales.  Abdominal: Soft. Bowel sounds are normal. No distension. Some tenderness to palpation.  No guarding.   Musculoskeletal: No edema or tenderness.  Lymphadenopathy: No cervical, axillary or supraclavicular adenopathy.  Neurological: Alert and oriented to person, place, and time. No cranial nerve deficit.  Skin: Skin is warm and dry. No rash noted. No erythema. No  pallor.  Psychiatric: Affect and judgment normal.   Labs No visits with results within 3 Day(s) from this visit.  Latest known visit with results is:  Hospital Outpatient Visit on 05/29/2018  Component Date Value Ref Range Status  . Tissue Transglutaminase Ab, IgA 05/29/2018 <2  0 - 3 U/mL Final   Comment: (NOTE)                              Negative        0 -  3                              Weak Positive   4 - 10                              Positive           >10 Tissue Transglutaminase (tTG) has been identified as the endomysial antigen.  Studies have demonstr- ated that endomysial IgA antibodies have over 99% specificity for gluten sensitive enteropathy. Performed At: [facility] [place], [place] [id number] Rush Farmer MD [id number] Performed at [facility], [address], [place], [place] [address]   . IgA 05/29/2018 216  87 - 352 mg/dL Final   Comment: (NOTE) Performed At: [facility] [place], [place] [id number] Rush Farmer MD [id number] Performed at [facility], [address], [place], [place] [address]   . WBC 05/29/2018 12.0* 4.0 - 10.5 K/uL Final  . RBC 05/29/2018 4.65  3.87 - 5.11 MIL/uL Final  . Hemoglobin 05/29/2018 13.6  12.0 - 15.0 g/dL Final  . HCT 05/29/2018 42.1  36.0 - 46.0 % Final  . MCV 05/29/2018 90.5  78.0 - 100.0 fL Final  . MCH 05/29/2018 29.2  26.0 - 34.0 pg Final  . MCHC 05/29/2018 32.3  30.0 - 36.0 g/dL Final  . RDW 05/29/2018 14.9  11.5 - 15.5 % Final  . Platelets 05/29/2018 424* 150 - 400 K/uL Final  . Neutrophils Relative % 05/29/2018 61  % Final  . Neutro Abs 05/29/2018  7.4  1.7 - 7.7 K/uL Final  . Lymphocytes Relative 05/29/2018 30  % Final  . Lymphs Abs 05/29/2018 3.6  0.7 - 4.0 K/uL Final  . Monocytes Relative 05/29/2018 5  % Final  . Monocytes Absolute 05/29/2018 0.6  0.1 - 1.0 K/uL Final  . Eosinophils Relative 05/29/2018 4  % Final  . Eosinophils Absolute 05/29/2018 0.5   0.0 - 0.7 K/uL Final  . Basophils Relative 05/29/2018 0  % Final  . Basophils Absolute 05/29/2018 0.0  0.0 - 0.1 K/uL Final   Performed at [facility], [address] [place], [place] [address]     Pathology Orders Placed This Encounter  Procedures  . CBC with Differential/Platelet    Standing Status:   Future    Standing Expiration Date:   06/12/2019  . Comprehensive metabolic panel    Standing Status:   Future    Standing Expiration Date:   06/12/2019  . Lactate dehydrogenase    Standing Status:   Future    Standing Expiration Date:   06/12/2019       Zoila Shutter MD

## 2018-06-11 NOTE — Patient Instructions (Signed)
[facility] Discharge Instructions  You saw Dr. Higgs today.   Thank you for choosing [facility] at [facility] to provide your oncology and hematology care.  To afford each patient quality time with our provider, please arrive at least 15 minutes before your scheduled appointment time.   If you have a lab appointment with the Cancer Center please come in thru the  Main Entrance and check in at the main information desk  You need to re-schedule your appointment should you arrive 10 or more minutes late.  We strive to give you quality time with our providers, and arriving late affects you and other patients whose appointments are after yours.  Also, if you no show three or more times for appointments you may be dismissed from the clinic at the providers discretion.     Again, thank you for choosing [facility].  Our hope is that these requests will decrease the amount of time that you wait before being seen by our physicians.       _____________________________________________________________  Should you have questions after your visit to [facility], please contact our office at [phone] between the hours of 8:30 a.m. and 4:30 p.m.  Voicemails left after 4:30 p.m. will not be returned until the following business day.  For prescription refill requests, have your pharmacy contact our office.       Resources For Cancer Patients and their Caregivers ? American Cancer Society: Can assist with transportation, wigs, general needs, runs Look Good Feel Better.        1-888-227-6333 ? Cancer Care: Provides financial assistance, online support groups, medication/co-pay assistance.  1-800-813-HOPE (4673) ? [facility] Assists [place] cancer patients and their families through emotional , educational and financial support.  336-427-4357 ? [place] DSS Where to apply for food  stamps, Medicaid and utility assistance. 336-342-1394 ? [facility]: Transportation to medical appointments. 336-347-2287 ? Social Security Administration: May apply for disability if have a Stage IV cancer. 336-342-7796 1-800-772-1213 ? [place] Aging, Disability and Transit Services: Assists with nutrition, care and transit needs. 336-349-2343  Cancer Center Support Programs:   > Cancer Support Group  2nd Tuesday of the month 1pm-2pm, Journey Room   > Creative Journey  3rd Tuesday of the month 1130am-1pm, Journey Room     

## 2018-06-18 ENCOUNTER — Telehealth: Payer: Self-pay

## 2018-06-18 NOTE — Telephone Encounter (Signed)
noted 

## 2018-06-18 NOTE — Telephone Encounter (Signed)
Pt called to cancel her upcoming procedure with RMR on 07/02/18. Pt wants to call back when she is ready to schedule procedure. Pt states that she has a procedure 2 weeks ago and she is still not feeling well. Routing message.

## 2018-06-18 NOTE — Telephone Encounter (Signed)
LMOVM for endo scheduler to cancel procedure 

## 2018-06-25 ENCOUNTER — Encounter (HOSPITAL_COMMUNITY): Admission: RE | Admit: 2018-06-25 | Payer: Medicare Other | Source: Ambulatory Visit

## 2018-07-01 DIAGNOSIS — D473 Essential (hemorrhagic) thrombocythemia: Secondary | ICD-10-CM | POA: Diagnosis not present

## 2018-07-01 DIAGNOSIS — E785 Hyperlipidemia, unspecified: Secondary | ICD-10-CM | POA: Diagnosis not present

## 2018-07-01 DIAGNOSIS — Z0000 Encounter for general adult medical examination without abnormal findings: Secondary | ICD-10-CM | POA: Diagnosis not present

## 2018-07-02 ENCOUNTER — Encounter (HOSPITAL_COMMUNITY): Admission: RE | Payer: Self-pay | Source: Ambulatory Visit

## 2018-07-02 ENCOUNTER — Ambulatory Visit (HOSPITAL_COMMUNITY): Admission: RE | Admit: 2018-07-02 | Payer: Medicare Other | Source: Ambulatory Visit | Admitting: Internal Medicine

## 2018-07-02 SURGERY — COLONOSCOPY WITH PROPOFOL
Anesthesia: Monitor Anesthesia Care

## 2018-07-08 DIAGNOSIS — Z6833 Body mass index (BMI) 33.0-33.9, adult: Secondary | ICD-10-CM | POA: Diagnosis not present

## 2018-07-08 DIAGNOSIS — M797 Fibromyalgia: Secondary | ICD-10-CM | POA: Diagnosis not present

## 2018-07-08 DIAGNOSIS — F319 Bipolar disorder, unspecified: Secondary | ICD-10-CM | POA: Diagnosis not present

## 2018-07-08 DIAGNOSIS — D473 Essential (hemorrhagic) thrombocythemia: Secondary | ICD-10-CM | POA: Diagnosis not present

## 2018-07-08 DIAGNOSIS — E782 Mixed hyperlipidemia: Secondary | ICD-10-CM | POA: Diagnosis not present

## 2018-07-08 DIAGNOSIS — M5135 Other intervertebral disc degeneration, thoracolumbar region: Secondary | ICD-10-CM | POA: Diagnosis not present

## 2018-07-16 IMAGING — NM NM BONE WHOLE BODY
4 series · 4 of 4 positions shown · non-contrast
Comparison: No prior.

CLINICAL DATA: Pain all over body.  DJD.  Fibromyalgia.  No injury.

EXAM:
NUCLEAR MEDICINE WHOLE BODY BONE SCAN
TECHNIQUE: Whole body anterior and posterior images were obtained approximately
3 hours after intravenous injection of radiopharmaceutical.
RADIOPHARMACEUTICALS:  19.9 mCi Yechnetium-FFm MDP IV

[Series 1: wbr_bone_60 whole body · 2.66mm/px · 1 of 1 slices shown (1 of 2)]
[im 1/1]
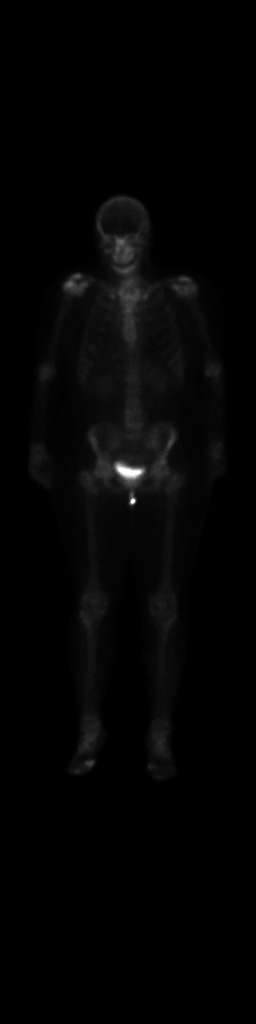

[Series 1: wbr_bone_60 whole body · 2.66mm/px · 1 of 1 slices shown (2 of 2)]
[im 1/1]
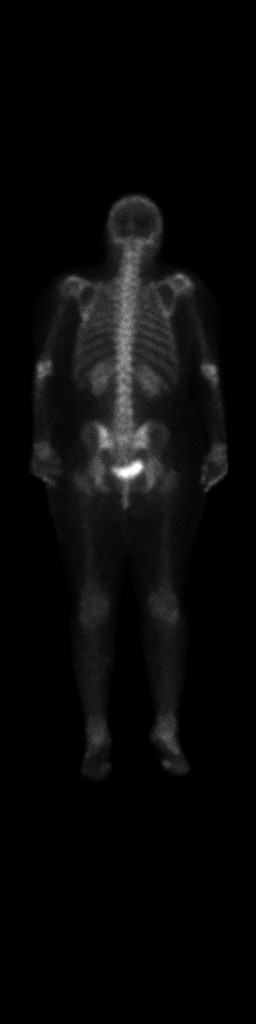

[Series 1: whole body · 2.66mm/px · 1 of 1 slices shown (1 of 2)]
[im 1/1]
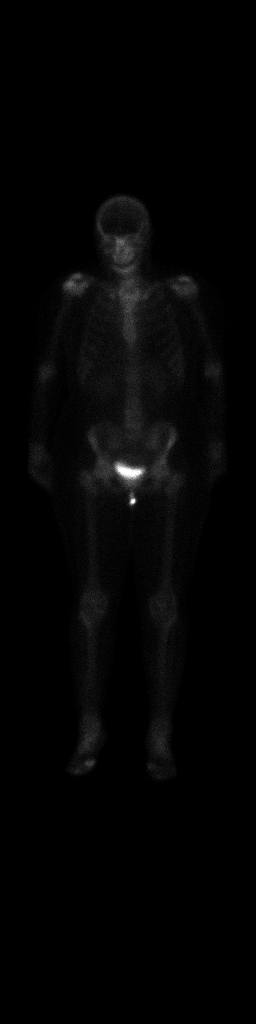

[Series 1: whole body · 2.66mm/px · 1 of 1 slices shown (2 of 2)]
[im 1/1]
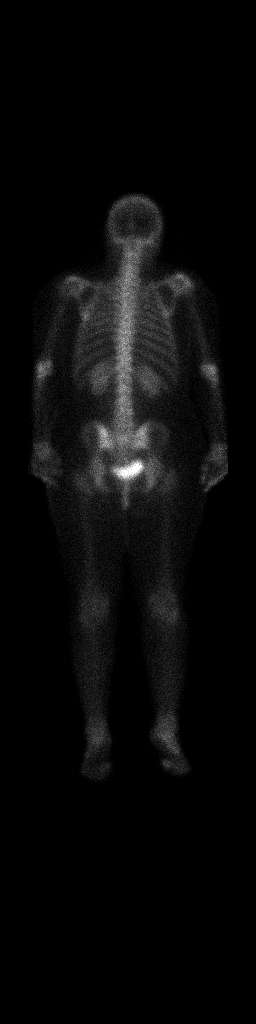

[4 of 4 positions shown; findings below may reference images not displayed]

FINDINGS: Bilateral renal function excretion. Mild increased activity noted
over both shoulders, elbows, wrists, ankles, and feet. This may be
secondary to an arthropathy such as DJD or inflammatory arthropathy.
No focal bony abnormalities otherwise noted.
IMPRESSION: Mild increased activity noted over both shoulders, elbows, wrists,
ankles, and feet. This may be secondary to an arthropathy such as
DJD or inflammatory arthropathy. No focal bony abnormalities
otherwise noted.

## 2018-11-30 ENCOUNTER — Inpatient Hospital Stay (HOSPITAL_COMMUNITY): Payer: Medicare Other | Attending: Hematology

## 2018-11-30 DIAGNOSIS — D473 Essential (hemorrhagic) thrombocythemia: Secondary | ICD-10-CM | POA: Diagnosis not present

## 2018-11-30 DIAGNOSIS — D500 Iron deficiency anemia secondary to blood loss (chronic): Secondary | ICD-10-CM | POA: Diagnosis not present

## 2018-11-30 DIAGNOSIS — N920 Excessive and frequent menstruation with regular cycle: Secondary | ICD-10-CM | POA: Diagnosis not present

## 2018-11-30 LAB — CBC WITH DIFFERENTIAL/PLATELET
ABS IMMATURE GRANULOCYTES: 0.05 10*3/uL (ref 0.00–0.07)
BASOS ABS: 0.1 10*3/uL (ref 0.0–0.1)
Basophils Relative: 1 %
EOS ABS: 0.3 10*3/uL (ref 0.0–0.5)
Eosinophils Relative: 3 %
HEMATOCRIT: 46.7 % — AB (ref 36.0–46.0)
Hemoglobin: 14.9 g/dL (ref 12.0–15.0)
IMMATURE GRANULOCYTES: 1 %
LYMPHS ABS: 2.6 10*3/uL (ref 0.7–4.0)
LYMPHS PCT: 24 %
MCH: 28.7 pg (ref 26.0–34.0)
MCHC: 31.9 g/dL (ref 30.0–36.0)
MCV: 90 fL (ref 80.0–100.0)
MONOS PCT: 7 %
Monocytes Absolute: 0.8 10*3/uL (ref 0.1–1.0)
NEUTROS PCT: 64 %
NRBC: 0 % (ref 0.0–0.2)
Neutro Abs: 7.2 10*3/uL (ref 1.7–7.7)
Platelets: 384 10*3/uL (ref 150–400)
RBC: 5.19 MIL/uL — ABNORMAL HIGH (ref 3.87–5.11)
RDW: 13.2 % (ref 11.5–15.5)
WBC: 11 10*3/uL — ABNORMAL HIGH (ref 4.0–10.5)

## 2018-11-30 LAB — COMPREHENSIVE METABOLIC PANEL
ALBUMIN: 4.3 g/dL (ref 3.5–5.0)
ALK PHOS: 90 U/L (ref 38–126)
ALT: 65 U/L — AB (ref 0–44)
AST: 53 U/L — AB (ref 15–41)
Anion gap: 8 (ref 5–15)
BILIRUBIN TOTAL: 0.4 mg/dL (ref 0.3–1.2)
BUN: 5 mg/dL — AB (ref 6–20)
CO2: 28 mmol/L (ref 22–32)
Calcium: 9.5 mg/dL (ref 8.9–10.3)
Chloride: 103 mmol/L (ref 98–111)
Creatinine, Ser: 0.86 mg/dL (ref 0.44–1.00)
GFR calc Af Amer: >60 mL/min (ref >60)
GLUCOSE: 89 mg/dL (ref 70–99)
Potassium: 4.7 mmol/L (ref 3.5–5.1)
Sodium: 139 mmol/L (ref 135–145)
TOTAL PROTEIN: 8.1 g/dL (ref 6.5–8.1)

## 2018-11-30 LAB — LACTATE DEHYDROGENASE: LDH: 174 U/L (ref 98–192)

## 2018-12-07 ENCOUNTER — Ambulatory Visit (HOSPITAL_COMMUNITY): Payer: Medicare Other | Admitting: Internal Medicine

## 2018-12-11 DIAGNOSIS — Z0000 Encounter for general adult medical examination without abnormal findings: Secondary | ICD-10-CM | POA: Diagnosis not present

## 2018-12-11 DIAGNOSIS — Z6833 Body mass index (BMI) 33.0-33.9, adult: Secondary | ICD-10-CM | POA: Diagnosis not present

## 2018-12-11 DIAGNOSIS — D473 Essential (hemorrhagic) thrombocythemia: Secondary | ICD-10-CM | POA: Diagnosis not present

## 2018-12-11 DIAGNOSIS — E785 Hyperlipidemia, unspecified: Secondary | ICD-10-CM | POA: Diagnosis not present

## 2018-12-18 DIAGNOSIS — D473 Essential (hemorrhagic) thrombocythemia: Secondary | ICD-10-CM | POA: Diagnosis not present

## 2018-12-18 DIAGNOSIS — M797 Fibromyalgia: Secondary | ICD-10-CM | POA: Diagnosis not present

## 2018-12-18 DIAGNOSIS — M5136 Other intervertebral disc degeneration, lumbar region: Secondary | ICD-10-CM | POA: Diagnosis not present

## 2018-12-18 DIAGNOSIS — E782 Mixed hyperlipidemia: Secondary | ICD-10-CM | POA: Diagnosis not present

## 2018-12-18 DIAGNOSIS — F319 Bipolar disorder, unspecified: Secondary | ICD-10-CM | POA: Diagnosis not present

## 2018-12-18 DIAGNOSIS — Z6835 Body mass index (BMI) 35.0-35.9, adult: Secondary | ICD-10-CM | POA: Diagnosis not present

## 2019-03-31 DIAGNOSIS — D473 Essential (hemorrhagic) thrombocythemia: Secondary | ICD-10-CM | POA: Diagnosis not present

## 2019-03-31 DIAGNOSIS — E782 Mixed hyperlipidemia: Secondary | ICD-10-CM | POA: Diagnosis not present

## 2019-03-31 DIAGNOSIS — E785 Hyperlipidemia, unspecified: Secondary | ICD-10-CM | POA: Diagnosis not present

## 2019-04-02 DIAGNOSIS — K047 Periapical abscess without sinus: Secondary | ICD-10-CM | POA: Diagnosis not present

## 2019-04-02 DIAGNOSIS — E669 Obesity, unspecified: Secondary | ICD-10-CM | POA: Diagnosis not present

## 2019-04-02 DIAGNOSIS — M5136 Other intervertebral disc degeneration, lumbar region: Secondary | ICD-10-CM | POA: Diagnosis not present

## 2019-04-02 DIAGNOSIS — E782 Mixed hyperlipidemia: Secondary | ICD-10-CM | POA: Diagnosis not present

## 2019-04-02 DIAGNOSIS — F319 Bipolar disorder, unspecified: Secondary | ICD-10-CM | POA: Diagnosis not present

## 2019-04-02 DIAGNOSIS — M797 Fibromyalgia: Secondary | ICD-10-CM | POA: Diagnosis not present

## 2019-04-02 DIAGNOSIS — R945 Abnormal results of liver function studies: Secondary | ICD-10-CM | POA: Diagnosis not present

## 2019-10-04 DIAGNOSIS — D473 Essential (hemorrhagic) thrombocythemia: Secondary | ICD-10-CM | POA: Diagnosis not present

## 2019-10-04 DIAGNOSIS — E785 Hyperlipidemia, unspecified: Secondary | ICD-10-CM | POA: Diagnosis not present

## 2019-10-04 DIAGNOSIS — E669 Obesity, unspecified: Secondary | ICD-10-CM | POA: Diagnosis not present

## 2019-10-04 DIAGNOSIS — E782 Mixed hyperlipidemia: Secondary | ICD-10-CM | POA: Diagnosis not present

## 2019-10-08 DIAGNOSIS — D45 Polycythemia vera: Secondary | ICD-10-CM | POA: Diagnosis not present

## 2019-10-08 DIAGNOSIS — M797 Fibromyalgia: Secondary | ICD-10-CM | POA: Diagnosis not present

## 2019-10-08 DIAGNOSIS — Z0001 Encounter for general adult medical examination with abnormal findings: Secondary | ICD-10-CM | POA: Diagnosis not present

## 2019-10-08 DIAGNOSIS — E669 Obesity, unspecified: Secondary | ICD-10-CM | POA: Diagnosis not present

## 2019-10-08 DIAGNOSIS — R945 Abnormal results of liver function studies: Secondary | ICD-10-CM | POA: Diagnosis not present

## 2019-10-08 DIAGNOSIS — E782 Mixed hyperlipidemia: Secondary | ICD-10-CM | POA: Diagnosis not present

## 2019-10-08 DIAGNOSIS — F319 Bipolar disorder, unspecified: Secondary | ICD-10-CM | POA: Diagnosis not present

## 2019-10-08 DIAGNOSIS — M5136 Other intervertebral disc degeneration, lumbar region: Secondary | ICD-10-CM | POA: Diagnosis not present

## 2019-10-08 DIAGNOSIS — Z23 Encounter for immunization: Secondary | ICD-10-CM | POA: Diagnosis not present

## 2019-10-08 DIAGNOSIS — F17210 Nicotine dependence, cigarettes, uncomplicated: Secondary | ICD-10-CM | POA: Diagnosis not present

## 2019-10-08 DIAGNOSIS — K047 Periapical abscess without sinus: Secondary | ICD-10-CM | POA: Diagnosis not present
